# Patient Record
Sex: Female | Born: 1990 | Race: White | Hispanic: No | Marital: Single | Smoking: Current every day smoker
Health system: Southern US, Community
[De-identification: ages and names within clinical notes are randomized; demographics above are authoritative.]

## PROBLEM LIST (undated history)

## (undated) DIAGNOSIS — F419 Anxiety disorder, unspecified: Secondary | ICD-10-CM

## (undated) DIAGNOSIS — R112 Nausea with vomiting, unspecified: Secondary | ICD-10-CM

## (undated) DIAGNOSIS — N83209 Unspecified ovarian cyst, unspecified side: Secondary | ICD-10-CM

## (undated) DIAGNOSIS — N760 Acute vaginitis: Secondary | ICD-10-CM

## (undated) DIAGNOSIS — B999 Unspecified infectious disease: Secondary | ICD-10-CM

## (undated) DIAGNOSIS — N390 Urinary tract infection, site not specified: Secondary | ICD-10-CM

## (undated) DIAGNOSIS — B9689 Other specified bacterial agents as the cause of diseases classified elsewhere: Secondary | ICD-10-CM

## (undated) DIAGNOSIS — Z9889 Other specified postprocedural states: Secondary | ICD-10-CM

## (undated) DIAGNOSIS — G971 Other reaction to spinal and lumbar puncture: Secondary | ICD-10-CM

## (undated) DIAGNOSIS — D649 Anemia, unspecified: Secondary | ICD-10-CM

---
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
## 2008-02-26 ENCOUNTER — Emergency Department (HOSPITAL_COMMUNITY): Admission: EM | Admit: 2008-02-26 | Discharge: 2008-02-26 | Payer: Self-pay | Admitting: Family Medicine

## 2008-04-02 ENCOUNTER — Emergency Department (HOSPITAL_COMMUNITY): Admission: EM | Admit: 2008-04-02 | Discharge: 2008-04-02 | Payer: Self-pay | Admitting: Emergency Medicine

## 2009-06-22 ENCOUNTER — Emergency Department (HOSPITAL_COMMUNITY): Admission: EM | Admit: 2009-06-22 | Discharge: 2009-06-23 | Payer: Self-pay | Admitting: Emergency Medicine

## 2009-07-20 ENCOUNTER — Emergency Department (HOSPITAL_COMMUNITY): Admission: EM | Admit: 2009-07-20 | Discharge: 2009-07-20 | Payer: Self-pay | Admitting: Emergency Medicine

## 2009-09-17 ENCOUNTER — Ambulatory Visit: Payer: Self-pay | Admitting: Diagnostic Radiology

## 2009-09-17 ENCOUNTER — Emergency Department (HOSPITAL_BASED_OUTPATIENT_CLINIC_OR_DEPARTMENT_OTHER): Admission: EM | Admit: 2009-09-17 | Discharge: 2009-09-17 | Payer: Self-pay | Admitting: Emergency Medicine

## 2009-11-14 ENCOUNTER — Emergency Department (HOSPITAL_BASED_OUTPATIENT_CLINIC_OR_DEPARTMENT_OTHER): Admission: EM | Admit: 2009-11-14 | Discharge: 2009-11-14 | Payer: Self-pay | Admitting: Emergency Medicine

## 2010-04-04 LAB — DIFFERENTIAL
Basophils Relative: 0 % (ref 0–1)
Eosinophils Absolute: 0.3 10*3/uL (ref 0.0–0.7)
Eosinophils Relative: 4 % (ref 0–5)
Lymphocytes Relative: 40 % (ref 12–46)
Lymphs Abs: 3.4 10*3/uL (ref 0.7–4.0)
Monocytes Absolute: 0.7 10*3/uL (ref 0.1–1.0)
Monocytes Relative: 9 % (ref 3–12)
Neutro Abs: 3.9 10*3/uL (ref 1.7–7.7)
Neutrophils Relative %: 47 % (ref 43–77)

## 2010-04-04 LAB — BASIC METABOLIC PANEL
Calcium: 8.8 mg/dL (ref 8.4–10.5)
Chloride: 109 mEq/L (ref 96–112)
GFR calc non Af Amer: >60 mL/min (ref >60)
Glucose, Bld: 95 mg/dL (ref 70–99)
Potassium: 3.7 mEq/L (ref 3.5–5.1)

## 2010-04-04 LAB — WET PREP, GENITAL
Trich, Wet Prep: NONE SEEN
Yeast Wet Prep HPF POC: NONE SEEN

## 2010-04-04 LAB — URINE CULTURE: Colony Count: >=100000

## 2010-04-04 LAB — URINALYSIS, ROUTINE W REFLEX MICROSCOPIC
Glucose, UA: NEGATIVE mg/dL
Hgb urine dipstick: NEGATIVE
Nitrite: NEGATIVE
Urobilinogen, UA: 1 mg/dL (ref 0.0–1.0)

## 2010-04-04 LAB — GC/CHLAMYDIA PROBE AMP, GENITAL
Chlamydia, DNA Probe: NEGATIVE
GC Probe Amp, Genital: NEGATIVE

## 2010-04-04 LAB — URINE MICROSCOPIC-ADD ON

## 2010-04-04 LAB — RPR: RPR Ser Ql: NONREACTIVE

## 2010-04-04 LAB — CBC: MCHC: 34.6 g/dL (ref 30.0–36.0)

## 2010-04-28 LAB — URINALYSIS, ROUTINE W REFLEX MICROSCOPIC
Glucose, UA: NEGATIVE mg/dL
Ketones, ur: NEGATIVE mg/dL
Nitrite: NEGATIVE
Specific Gravity, Urine: 1.028 (ref 1.005–1.030)
Urobilinogen, UA: 0.2 mg/dL (ref 0.0–1.0)
pH: 5.5 (ref 5.0–8.0)

## 2010-04-28 LAB — URINE MICROSCOPIC-ADD ON

## 2010-04-28 LAB — COMPREHENSIVE METABOLIC PANEL
ALT: 17 U/L (ref 0–35)
Alkaline Phosphatase: 90 U/L (ref 39–117)
BUN: 13 mg/dL (ref 6–23)
GFR calc non Af Amer: >60 mL/min (ref >60)
Glucose, Bld: 78 mg/dL (ref 70–99)
Potassium: 4.7 mEq/L (ref 3.5–5.1)
Sodium: 140 mEq/L (ref 135–145)
Total Bilirubin: 0.9 mg/dL (ref 0.3–1.2)

## 2010-04-28 LAB — DIFFERENTIAL
Basophils Relative: 0 % (ref 0–1)
Eosinophils Absolute: 0.2 10*3/uL (ref 0.0–0.7)
Lymphs Abs: 0.8 10*3/uL (ref 0.7–4.0)
Neutrophils Relative %: 88 % — ABNORMAL HIGH (ref 43–77)

## 2010-04-28 LAB — LACTIC ACID, PLASMA: Lactic Acid, Venous: 0.4 mmol/L — ABNORMAL LOW (ref 0.5–2.2)

## 2010-05-23 ENCOUNTER — Emergency Department: Payer: Self-pay | Admitting: Emergency Medicine

## 2010-11-13 ENCOUNTER — Emergency Department (HOSPITAL_COMMUNITY)
Admission: EM | Admit: 2010-11-13 | Discharge: 2010-11-13 | Discharge status: Against Medical Advice | Payer: Self-pay | Attending: Emergency Medicine | Admitting: Emergency Medicine

## 2010-11-13 DIAGNOSIS — R109 Unspecified abdominal pain: Secondary | ICD-10-CM | POA: Insufficient documentation

## 2010-11-19 ENCOUNTER — Inpatient Hospital Stay (HOSPITAL_COMMUNITY)
Admission: AD | Admit: 2010-11-19 | Discharge: 2010-11-20 | Disposition: A | Discharge status: Home / Self Care | Payer: Medicaid Other | Source: Ambulatory Visit | Attending: Family Medicine | Admitting: Family Medicine

## 2010-11-19 DIAGNOSIS — N390 Urinary tract infection, site not specified: Secondary | ICD-10-CM | POA: Insufficient documentation

## 2010-11-19 DIAGNOSIS — R109 Unspecified abdominal pain: Secondary | ICD-10-CM | POA: Insufficient documentation

## 2010-11-19 HISTORY — DX: Other specified bacterial agents as the cause of diseases classified elsewhere: B96.89

## 2010-11-19 HISTORY — DX: Urinary tract infection, site not specified: N39.0

## 2010-11-19 HISTORY — DX: Other specified postprocedural states: Z98.890

## 2010-11-19 HISTORY — DX: Anxiety disorder, unspecified: F41.9

## 2010-11-19 HISTORY — DX: Unspecified infectious disease: B99.9

## 2010-11-19 HISTORY — DX: Other reaction to spinal and lumbar puncture: G97.1

## 2010-11-19 HISTORY — DX: Other specified postprocedural states: R11.2

## 2010-11-19 HISTORY — DX: Acute vaginitis: N76.0

## 2010-11-19 HISTORY — DX: Unspecified ovarian cyst, unspecified side: N83.209

## 2010-11-19 HISTORY — DX: Anemia, unspecified: D64.9

## 2010-11-19 LAB — HCG, QUANTITATIVE, PREGNANCY: hCG, Beta Chain, Quant, S: <1 m[IU]/mL (ref <5)

## 2010-11-19 LAB — CBC
MCH: 32.9 pg (ref 26.0–34.0)
MCHC: 34.1 g/dL (ref 30.0–36.0)
MCV: 96.5 fL (ref 78.0–100.0)
Platelets: 219 10*3/uL (ref 150–400)

## 2010-11-19 LAB — URINALYSIS, ROUTINE W REFLEX MICROSCOPIC
Glucose, UA: NEGATIVE mg/dL
Nitrite: POSITIVE — AB
Protein, ur: 30 mg/dL — AB
Urobilinogen, UA: 2 mg/dL — ABNORMAL HIGH (ref 0.0–1.0)
pH: 7 (ref 5.0–8.0)

## 2010-11-19 LAB — URINE MICROSCOPIC-ADD ON

## 2010-11-19 LAB — POCT PREGNANCY, URINE: Preg Test, Ur: NEGATIVE

## 2010-11-19 NOTE — Progress Notes (Addendum)
Pt stated she took a HPT that was positive this morning. Then she started bleeding this afternoon and  Second pregnancy test was negative. Also reports having  Lower pelvic abd pain  X 1 week

## 2010-11-20 ENCOUNTER — Encounter (HOSPITAL_COMMUNITY): Payer: Self-pay | Admitting: *Deleted

## 2010-11-20 NOTE — ED Notes (Signed)
Marie Williams CNM at bedside 

## 2010-11-23 NOTE — ED Provider Notes (Signed)
History     Chief Complaint  Patient presents with  . Abdominal Pain  . Possible Pregnancy   HPI 20 y.o. who presents with c/o + UPT at home then had a negative one. Started period yesterday. Has had some dysuria and pelvic pain   (seen written note scanned in, during downtime)  OB History    Grav Para Term Preterm Abortions TAB SAB Ect Mult Living   1 1 1       1       Past Medical History  Diagnosis Date  . PONV (postoperative nausea and vomiting)   . Spinal headache   . Infection   . Urinary tract infection   . Bacterial vaginosis   . Ovarian cyst 2008  . Anxiety   . Anemia     Past Surgical History  Procedure Date  . Cesarean section     Family History  Problem Relation Age of Onset  . Anesthesia problems Neg Hx   . Hypotension Neg Hx   . Malignant hyperthermia Neg Hx   . Pseudochol deficiency Neg Hx     History  Substance Use Topics  . Smoking status: Current Everyday Smoker -- 0.5 packs/day  . Smokeless tobacco: Never Used  . Alcohol Use: No    Allergies: No Known Allergies  No prescriptions prior to admission    ROS See above  Physical Exam   Blood pressure 116/78, pulse 81, temperature 97.8 F (36.6 C), temperature source Oral, resp. rate 20, height 5\' 7"  (1.702 m), weight 183 lb 3.2 oz (83.099 kg), last menstrual period 10/25/2010.  Physical Exam  Constitutional: She is oriented to person, place, and time. She appears well-developed and well-nourished. No distress.  HENT:  Head: Normocephalic.  Cardiovascular: Normal rate.   Respiratory: Effort normal.  GI: Soft.  Genitourinary: Vagina normal and uterus normal. No vaginal discharge found.       No blood in vault, uterus nontender not enlarged  Musculoskeletal: Normal range of motion.  Neurological: She is alert and oriented to person, place, and time.  Skin: Skin is warm and dry.  Psychiatric: She has a normal mood and affect.   Quant HCG <1 Wet prep  See results UA +  nitrites  MAU Course  Procedures  Assessment and Plan  Not Pregnant UTI  Will treat with Septra DS X 3 days Followup as needed [facility] 11/23/2010, 4:48 PM

## 2010-11-24 NOTE — ED Provider Notes (Signed)
Chart reviewed and agree with management and plan.  

## 2012-10-19 ENCOUNTER — Emergency Department (INDEPENDENT_AMBULATORY_CARE_PROVIDER_SITE_OTHER): Payer: Self-pay

## 2012-10-19 ENCOUNTER — Emergency Department (INDEPENDENT_AMBULATORY_CARE_PROVIDER_SITE_OTHER)
Admission: EM | Admit: 2012-10-19 | Discharge: 2012-10-19 | Disposition: A | Discharge status: Home / Self Care | Payer: Self-pay | Source: Home / Self Care | Attending: Family Medicine | Admitting: Family Medicine

## 2012-10-19 ENCOUNTER — Encounter (HOSPITAL_COMMUNITY): Payer: Self-pay | Admitting: *Deleted

## 2012-10-19 DIAGNOSIS — S93401A Sprain of unspecified ligament of right ankle, initial encounter: Secondary | ICD-10-CM

## 2012-10-19 DIAGNOSIS — S93409A Sprain of unspecified ligament of unspecified ankle, initial encounter: Secondary | ICD-10-CM

## 2012-10-19 LAB — POCT PREGNANCY, URINE: Preg Test, Ur: NEGATIVE

## 2012-10-19 LAB — POCT URINALYSIS DIP (DEVICE)
Bilirubin Urine: NEGATIVE
Ketones, ur: NEGATIVE mg/dL
Leukocytes, UA: NEGATIVE
Protein, ur: NEGATIVE mg/dL
Specific Gravity, Urine: 1.025 (ref 1.005–1.030)
pH: 7.5 (ref 5.0–8.0)

## 2012-10-19 NOTE — ED Notes (Signed)
Reports slipping in puddle of water at [employer] @ approx 1600, injuring right ankle.  Small bruise without swelling to right patella.  C/O difficulty bearing weight due to ankle pain.

## 2012-10-19 NOTE — ED Provider Notes (Signed)
CSN: [id number]     Arrival date & time 10/19/12  1734 History   First MD Initiated Contact with Patient 10/19/12 1747     Chief Complaint  Patient presents with  . Fall  . Ankle Pain   (Consider location/radiation/quality/duration/timing/severity/associated sxs/prior Treatment) Patient is a 22 y.o. female presenting with ankle pain. The history is provided by the patient and the spouse.  Ankle Pain Location:  Ankle Time since incident:  2 hours Injury: yes   Mechanism of injury: fall   Fall:    Fall occurred: on wet floor in [employer], landing on right side.   Impact surface:  Concrete   Entrapped after fall: no   Ankle location:  R ankle Pain details:    Quality:  Sharp Associated symptoms comment:  Nausea last eve, concerned about possible preg, sore on right iliac crest from fall, no n/v/d today.   Past Medical History  Diagnosis Date  . PONV (postoperative nausea and vomiting)   . Spinal headache   . Infection   . Urinary tract infection   . Bacterial vaginosis   . Ovarian cyst 2008  . Anxiety   . Anemia    Past Surgical History  Procedure Laterality Date  . Cesarean section     Family History  Problem Relation Age of Onset  . Anesthesia problems Neg Hx   . Hypotension Neg Hx   . Malignant hyperthermia Neg Hx   . Pseudochol deficiency Neg Hx    History  Substance Use Topics  . Smoking status: Current Every Day Smoker -- 0.50 packs/day    Types: Cigarettes  . Smokeless tobacco: Never Used  . Alcohol Use: No   OB History   Grav Para Term Preterm Abortions TAB SAB Ect Mult Living   1 1 1       1      Review of Systems  Allergies  Review of patient's allergies indicates no known allergies.  Home Medications   Current Outpatient Rx  Name  Route  Sig  Dispense  Refill  . prenatal vitamin w/FE, FA (PRENATAL 1 + 1) 27-1 MG TABS   Oral   Take 1 tablet by mouth daily.            BP 118/76  Pulse 81  Temp(Src) 98 F (36.7 C) (Oral)  Resp 18   SpO2 96%  LMP 10/05/2012 Physical Exam  Nursing note and vitals reviewed. Constitutional: She is oriented to person, place, and time. She appears well-developed and well-nourished. No distress.  Abdominal: Soft. Bowel sounds are normal. She exhibits no mass. There is no tenderness. There is no rebound and no guarding.  Musculoskeletal: She exhibits tenderness.       Right ankle: She exhibits swelling. She exhibits no ecchymosis, no deformity and normal pulse. Tenderness. Lateral malleolus tenderness found. No medial malleolus, no head of 5th metatarsal and no proximal fibula tenderness found. Achilles tendon normal.  Neurological: She is alert and oriented to person, place, and time.  Skin: Skin is warm and dry.    ED Course  Procedures (including critical care time) Labs Review Labs Reviewed  POCT URINALYSIS DIP (DEVICE)  POCT PREGNANCY, URINE   Imaging Review Dg Ankle Complete Right  10/19/2012   CLINICAL DATA:  Fall, ankle pain, remote distal tibial fracture.  EXAM: RIGHT ANKLE - COMPLETE 3+ VIEW  COMPARISON:  09/17/2009  FINDINGS: Ankle mortise intact. The talar dome is normal. No malleolar fracture. The calcaneus is normal. Small cortical irregularity along  the anterior distal tibia at site of prior incomplete fracture.  IMPRESSION: No acute osseous abnormality.   Electronically Signed   By: Genevive Bi M.D.   On: 10/19/2012 18:16    MDM  X-rays reviewed and report per radiologist.     Linna Hoff, MD 10/19/12 [phone]

## 2013-04-30 ENCOUNTER — Encounter: Payer: Self-pay | Admitting: Obstetrics & Gynecology

## 2013-04-30 ENCOUNTER — Ambulatory Visit (INDEPENDENT_AMBULATORY_CARE_PROVIDER_SITE_OTHER): Payer: Medicaid Other | Admitting: Obstetrics & Gynecology

## 2013-04-30 ENCOUNTER — Other Ambulatory Visit (HOSPITAL_COMMUNITY)
Admission: RE | Admit: 2013-04-30 | Discharge: 2013-04-30 | Disposition: A | Discharge status: Home / Self Care | Payer: Medicaid Other | Source: Ambulatory Visit | Attending: Obstetrics & Gynecology | Admitting: Obstetrics & Gynecology

## 2013-04-30 VITALS — BP 117/70 | Wt 191.0 lb

## 2013-04-30 DIAGNOSIS — Z113 Encounter for screening for infections with a predominantly sexual mode of transmission: Secondary | ICD-10-CM | POA: Insufficient documentation

## 2013-04-30 DIAGNOSIS — O09299 Supervision of pregnancy with other poor reproductive or obstetric history, unspecified trimester: Secondary | ICD-10-CM

## 2013-04-30 DIAGNOSIS — O34219 Maternal care for unspecified type scar from previous cesarean delivery: Secondary | ICD-10-CM

## 2013-04-30 DIAGNOSIS — O99331 Smoking (tobacco) complicating pregnancy, first trimester: Secondary | ICD-10-CM | POA: Insufficient documentation

## 2013-04-30 DIAGNOSIS — Z01419 Encounter for gynecological examination (general) (routine) without abnormal findings: Secondary | ICD-10-CM | POA: Insufficient documentation

## 2013-04-30 DIAGNOSIS — O99330 Smoking (tobacco) complicating pregnancy, unspecified trimester: Secondary | ICD-10-CM

## 2013-04-30 MED ORDER — NICOTINE 21 MG/24HR TD PT24: 21.0000 mg | MEDICATED_PATCH | Freq: Every day | TRANSDERMAL | Status: AC

## 2013-04-30 NOTE — Progress Notes (Signed)
P-95 Head circ measures 17weeks and one day which is consistent with LMP dating of 17wk 3days.

## 2013-04-30 NOTE — Patient Instructions (Signed)
Vaginal Birth After Cesarean Delivery Vaginal birth after cesarean delivery (VBAC) is giving birth vaginally after previously delivering a baby by a cesarean. In the past, if a woman had a cesarean delivery, all births afterwards would be done by cesarean delivery. This is no longer true. It can be safe for the mother to try a vaginal delivery after having a cesarean delivery.  It is important to discuss VBAC with your health care provider early in the pregnancy so you can understand the risks, benefits, and options. It will give you time to decide what is best in your particular case. The final decision about whether to have a VBAC or repeat cesarean delivery should be between you and your health care provider. Any changes in your health or your baby's health during your pregnancy may make it necessary to change your initial decision about VBAC.  WOMEN WHO PLAN TO HAVE A VBAC SHOULD CHECK WITH THEIR HEALTH CARE PROVIDER TO BE SURE THAT:  The previous cesarean delivery was done with a low transverse uterine cut (incision) (not a vertical classical incision).   The birth canal is big enough for the baby.   There were no other operations on the uterus.   An electronic fetal monitor (EFM) will be on at all times during labor.   An operating room will be available and ready in case an emergency cesarean delivery is needed.   A health care provider and surgical nursing staff will be available at all times during labor to be ready to do an emergency delivery cesarean if necessary.   An anesthesiologist will be present in case an emergency cesarean delivery is needed.   The nursery is prepared and has adequate personnel and necessary equipment available to care for the baby in case of an emergency cesarean delivery. BENEFITS OF VBAC  Shorter stay in the hospital.   Avoidance of risks associated with cesarean delivery, such as:  Surgical complications, such as opening of the incision or  hernia in the incision.  Injury to other organs.  Fever. This can occur if an infection develops after surgery. It can also occur as a reaction to the medicine given to make you numb during the surgery.  Less blood loss and need for blood transfusions.  Lower risk of blood clots and infection.  Shorter recovery.   Decreased risk for having to remove the uterus (hysterectomy).   Decreased risk for the placenta to completely or partially cover the opening of the uterus (placenta previa) with a future pregnancy.   Decrease risk in future labor and delivery. RISKS OF A VBAC  Tearing (rupture) of the uterus. This is occurs in less than 1% of VBACs. The risk of this happening is higher if:  Steps are taken to begin the labor process (induce labor) or stimulate or strengthen contractions (augment labor).   Medicine is used to soften (ripen) the cervix.  Having to remove the uterus (hysterectomy) if it ruptures. VBAC SHOULD NOT BE DONE IF:  The previous cesarean delivery was done with a vertical (classical) or T-shaped incision or you do not know what kind of incision was made.   You had a ruptured uterus.   You have had certain types of surgery on your uterus, such as removal of uterine fibroids. Ask your health care provider about other types of surgeries that prevent you from having a VBAC.  You have certain medical or childbirth (obstetrical) problems.   There are problems with the baby.   You   have had two previous cesarean deliveries and no vaginal deliveries. OTHER FACTS TO KNOW ABOUT VBAC:  It is safe to have an epidural anesthetic with VBAC.   It is safe to turn the baby from a breech position (attempt an external cephalic version).   It is safe to try a VBAC with twins.   VBAC may not be successful if your baby weights 8.8 lb (4 kg) or more. However, weight predictions are not always accurate and should not be used alone to decide if VBAC is right for  you.  There is an increased failure rate if the time between the cesarean delivery and VBAC is less than 19 months.   Your health care provider may advise against a VBAC if you have preeclampsia (high blood pressure, protein in the urine, and swelling of face and extremities).   VBAC is often successful if you previously gave birth vaginally.   VBAC is often successful when the labor starts spontaneously before the due date.   Delivering a baby through a VBAC is similar to having a normal spontaneous vaginal delivery. Document Released: 06/25/2006 Document Revised: 10/23/2012 Document Reviewed: 08/01/2012 Exit[employer] Patient Information 2014 [employer].  

## 2013-04-30 NOTE — Progress Notes (Signed)
   Subjective:    Carol Abbott is a 23 y.o. G2P1001at 3256w3d being seen today for her first obstetrical visit.  Her obstetrical history is significant for previous term LTCS for FTP, baby has cerebral palsy and Danfy-Walker syndrome and requires a lot of help. Patient does intend to breast feed. Pregnancy history fully reviewed.  Patient reports no complaints.  She smokes 1/2 ppd of cigarettes, desires to quit.  Filed Vitals:   04/30/13 0934  BP: 117/70  Weight: 191 lb (86.637 kg)    HISTORY: OB History  Gravida Para Term Preterm AB SAB TAB Ectopic Multiple Living  2 1 1       1     # Outcome Date GA Lbr Len/2nd Weight Sex Delivery Anes PTL Lv  2 CUR           1 TRM 11/22/07 1212w0d  5 lb 7.3 oz (2.475 kg) F LTCS  N Y     Comments: LTCS for FTP     Past Medical History  Diagnosis Date  . PONV (postoperative nausea and vomiting)   . Spinal headache   . Infection   . Urinary tract infection   . Bacterial vaginosis   . Ovarian cyst 2008  . Anxiety   . Anemia    Past Surgical History  Procedure Laterality Date  . Cesarean section     Family History  Problem Relation Age of Onset  . Anesthesia problems Neg Hx   . Hypotension Neg Hx   . Malignant hyperthermia Neg Hx   . Pseudochol deficiency Neg Hx      Exam    Uterus:     Pelvic Exam:    Perineum: No Hemorrhoids, Normal Perineum   Vulva: normal   Vagina:  normal mucosa   Cervix: no bleeding following Pap and no cervical motion tenderness   Adnexa: normal adnexa and no mass, fullness, tenderness   Bony Pelvis: gynecoid  System: Breast:  normal appearance, no masses or tenderness   Skin: normal coloration and turgor, no rashes    Neurologic: oriented, normal   Extremities: normal strength, tone, and muscle mass   HEENT PERRLA and extra ocular movement intact   Mouth/Teeth mucous membranes moist, pharynx normal without lesions and dental hygiene poor   Neck supple and no masses   Cardiovascular: regular  rate and rhythm   Respiratory:  appears well, vitals normal, no respiratory distress, acyanotic, normal RR   Abdomen: soft, non-tender; bowel sounds normal; no masses,  no organomegaly   Urinary: urethral meatus normal      Assessment:    Pregnancy: G2P1001 Patient Active Problem List   Diagnosis Date Noted  . Previous child with anomaly, antepartum 04/30/2013  . Previous cesarean section complicating pregnancy, antepartum 04/30/2013  . Tobacco smoking complicating pregnancy 04/30/2013     Plan:   Initial labs drawn. Prenatal vitamins.  Counseled about dangers of smoking in pregnancy. Patient agreed to try nicotine patch, this was prescribed. Problem list reviewed and updated. Genetic Screening discussed Quad Screen: ordered. Ultrasound discussed; [facility]. TOLAC vs RCS risks and benefits discussed in detail. Patient elects for TOLAC, consent to be signed next visit. Follow up in 4 weeks.   Tereso NewcomerUgonna A Doroteo Nickolson, MD 04/30/2013

## 2013-05-01 ENCOUNTER — Encounter: Payer: Self-pay | Admitting: Obstetrics & Gynecology

## 2013-05-01 DIAGNOSIS — Z2839 Other underimmunization status: Secondary | ICD-10-CM | POA: Insufficient documentation

## 2013-05-01 DIAGNOSIS — O9989 Other specified diseases and conditions complicating pregnancy, childbirth and the puerperium: Secondary | ICD-10-CM

## 2013-05-01 DIAGNOSIS — Z283 Underimmunization status: Secondary | ICD-10-CM | POA: Insufficient documentation

## 2013-05-01 LAB — OBSTETRIC PANEL
Antibody Screen: NEGATIVE
BASOS ABS: 0 10*3/uL (ref 0.0–0.1)
Basophils Relative: 0 % (ref 0–1)
EOS ABS: 0.3 10*3/uL (ref 0.0–0.7)
EOS PCT: 3 % (ref 0–5)
HCT: 35.6 % — ABNORMAL LOW (ref 36.0–46.0)
Hemoglobin: 12.5 g/dL (ref 12.0–15.0)
Hepatitis B Surface Ag: NEGATIVE
LYMPHS PCT: 18 % (ref 12–46)
Lymphs Abs: 1.6 10*3/uL (ref 0.7–4.0)
MCH: 32.6 pg (ref 26.0–34.0)
MCHC: 35.1 g/dL (ref 30.0–36.0)
MCV: 93 fL (ref 78.0–100.0)
Monocytes Absolute: 0.4 10*3/uL (ref 0.1–1.0)
Monocytes Relative: 5 % (ref 3–12)
Neutro Abs: 6.4 10*3/uL (ref 1.7–7.7)
Neutrophils Relative %: 74 % (ref 43–77)
PLATELETS: 229 10*3/uL (ref 150–400)
RBC: 3.83 MIL/uL — ABNORMAL LOW (ref 3.87–5.11)
RDW: 14.2 % (ref 11.5–15.5)
Rh Type: POSITIVE
Rubella: 0.43 Index (ref <0.90)
WBC: 8.7 10*3/uL (ref 4.0–10.5)

## 2013-05-01 LAB — AFP, QUAD SCREEN
AFP: 32.9 IU/mL
CURR GEST AGE: 17.3 wks.days
Down Syndrome Scr Risk Est: 1:500 {titer}
HCG, Total: 19068 m[IU]/mL
INH: 397.3 pg/mL
Interpretation-AFP: NEGATIVE
MOM FOR AFP: 1.1
MoM for INH: 2.44
MoM for hCG: 1.18
Open Spina bifida: NEGATIVE
Tri 18 Scr Risk Est: NEGATIVE
UE3 VALUE: 0.4 ng/mL
uE3 Mom: 0.62

## 2013-05-01 LAB — ALCOHOL METABOLITE (ETG), URINE: ETGU: NEGATIVE ng/mL

## 2013-05-01 LAB — HIV ANTIBODY (ROUTINE TESTING W REFLEX): HIV: NONREACTIVE

## 2013-05-01 LAB — CULTURE, OB URINE
COLONY COUNT: NO GROWTH
ORGANISM ID, BACTERIA: NO GROWTH

## 2013-05-02 LAB — CYSTIC FIBROSIS DIAGNOSTIC STUDY

## 2013-05-03 LAB — COCAINE METABOLITE (GC/LC/MS), URINE: BENZOYLECGONINE GC/MS CONF: 275 ng/mL — AB

## 2013-05-05 ENCOUNTER — Encounter: Payer: Self-pay | Admitting: Obstetrics & Gynecology

## 2013-05-05 DIAGNOSIS — F1410 Cocaine abuse, uncomplicated: Secondary | ICD-10-CM | POA: Insufficient documentation

## 2013-05-05 DIAGNOSIS — O99322 Drug use complicating pregnancy, second trimester: Secondary | ICD-10-CM

## 2013-05-05 LAB — PRESCRIPTION MONITORING PROFILE (19 PANEL)
Amphetamine/Meth: NEGATIVE ng/mL
Barbiturate Screen, Urine: NEGATIVE ng/mL
Benzodiazepine Screen, Urine: NEGATIVE ng/mL
Buprenorphine, Urine: NEGATIVE ng/mL
CREATININE, URINE: 261.89 mg/dL (ref >20.0)
Cannabinoid Scrn, Ur: NEGATIVE ng/mL
Carisoprodol, Urine: NEGATIVE ng/mL
ECSTASY: NEGATIVE ng/mL
FENTANYL URINE: NEGATIVE ng/mL
MEPERIDINE UR: NEGATIVE ng/mL
Methadone Screen, Urine: NEGATIVE ng/mL
Methaqualone: NEGATIVE ng/mL
Nitrites, Initial: NEGATIVE ug/mL
OXYCODONE SCRN UR: NEGATIVE ng/mL
Opiate Screen, Urine: NEGATIVE ng/mL
PH URINE, INITIAL: 6.4 pH (ref 4.5–8.9)
Phencyclidine, Ur: NEGATIVE ng/mL
Propoxyphene: NEGATIVE ng/mL
TRAMADOL UR: NEGATIVE ng/mL
Tapentadol, urine: NEGATIVE ng/mL
Zolpidem, Urine: NEGATIVE ng/mL

## 2013-05-08 ENCOUNTER — Ambulatory Visit (HOSPITAL_COMMUNITY)
Admission: RE | Admit: 2013-05-08 | Discharge: 2013-05-08 | Disposition: A | Discharge status: Home / Self Care | Payer: Medicaid Other | Source: Ambulatory Visit | Attending: Family Medicine | Admitting: Family Medicine

## 2013-05-08 ENCOUNTER — Ambulatory Visit (HOSPITAL_COMMUNITY)
Admission: RE | Admit: 2013-05-08 | Discharge: 2013-05-08 | Disposition: A | Discharge status: Home / Self Care | Payer: Medicaid Other | Source: Ambulatory Visit | Attending: Obstetrics & Gynecology | Admitting: Obstetrics & Gynecology

## 2013-05-08 DIAGNOSIS — IMO0002 Reserved for concepts with insufficient information to code with codable children: Secondary | ICD-10-CM | POA: Insufficient documentation

## 2013-05-08 DIAGNOSIS — Z8269 Family history of other diseases of the musculoskeletal system and connective tissue: Secondary | ICD-10-CM | POA: Insufficient documentation

## 2013-05-08 DIAGNOSIS — O34219 Maternal care for unspecified type scar from previous cesarean delivery: Secondary | ICD-10-CM | POA: Insufficient documentation

## 2013-05-08 DIAGNOSIS — O09299 Supervision of pregnancy with other poor reproductive or obstetric history, unspecified trimester: Secondary | ICD-10-CM

## 2013-05-09 ENCOUNTER — Encounter: Payer: Self-pay | Admitting: Obstetrics & Gynecology

## 2013-05-09 ENCOUNTER — Encounter (HOSPITAL_COMMUNITY): Payer: Self-pay

## 2013-05-09 DIAGNOSIS — O350XX0 Maternal care for (suspected) central nervous system malformation in fetus, not applicable or unspecified: Secondary | ICD-10-CM | POA: Insufficient documentation

## 2013-05-09 DIAGNOSIS — O3503X0 Maternal care for (suspected) central nervous system malformation or damage in fetus, choroid plexus cysts, not applicable or unspecified: Secondary | ICD-10-CM | POA: Insufficient documentation

## 2013-05-09 NOTE — Progress Notes (Signed)
Genetic Abbott  High-Risk Gestation Note  Appointment Date:  05/08/2013 Referred By: Carol Jude, MD Date of Birth:  [birth year]    Pregnancy History: G2P1001 Estimated Date of Delivery: 10/05/13 Estimated Gestational Age: 66w4dAttending: JJolyn Lent MD   I met with [name] because of a family history of Carol Abbott. Ms. AThurlowwas accompanied by her daughter to today's visit.   We began by reviewing the family history in detail. The couple's daughter, Carol Abbott has Carol Abbott. The patient reported that this was seen on prenatal ultrasound during her pregnancy. [place]. Her daughter also has seizures, which began at age 25 months She is currently followed by neurologist, Dr. HGaynell Abbott She has developmental delay. The patient reported that her daughter is unable to walk and does not have much developed speech. She says a few words and can shake her head to communicate. [place] [facility] [name] reports that an underlying cause was not found for her daughter's features and that "all testing was normal."  We do not currently have documentation regarding what testing was performed.   Carol Abbott (DVM) is a congenital brain malformation characterized by enlargement of the fourth ventricle, absence of the cerebellar vermis, and the formation of a cyst near the base of the skull.  This finding is associated with other intra and extracranial anomalies in 50% and 35% of cases, respectively.  DWM can be caused by chromosomal, single gene, multifactorial, and environmental etiologies. The majority of apparently isolated cases of DWM appear to be sporadic. Carol Abbott the presence of teratogens during her pregnancy with her daughter that have been associated with DVM, including alcohol, uncontrolled maternal diabetes mellitus, and infections (rubella).  Given  that DWM has also been associated with various single gene conditions, we reviewed chromosomes, genes, and various inheritance patterns, specifically autosomal recessive inheritance. When sporadic, the estimated recurrence risk for siblings is approximately 1-5%. In the case of single gene disorders or autosomal recessive inheritance, the recurrence risk may be up to 25%.  In summary, she was counseled that the risk of recurrence for the current pregnancy depends on the cause for her daughter's DWM, ranging from <1 to 25%, accounting for recessive genetic syndromes.  This range can be further modified, if an etiology is discovered. In the absence of an identified genetic etiology, prenatal testing via amniocentesis would not be expected to be informative for the current pregnancy. We discussed that targeted ultrasound is available to assess fetal growth and anatomy in detail. Targeted ultrasound was performed today. No gross fetal anomalies were visualized today. See separate ultrasound report for complete discussion regarding today's ultrasound. Follow-up ultrasound was planned in 4 weeks. She understands that ultrasound cannot rule out all birth defects or genetic syndromes.  The family histories were otherwise found to be contributory for learning disabilities for individuals in the father of the pregnancy's family. Reportedly his maternal half-sister and maternal half-brother (full siblings to each other), and his mother has learning disabilities. They are unable to read or write well per the patient's report. His half-sister's two sons reportedly also have learning disabilities and speech impediments. An underlying cause is not known for the learning disabilities. The father of the pregnancy was described to have no learning disabilities. Carol Abbott counseled that there are many different causes of intellectual disabilities including environmental, multifactorial, and genetic etiologies.  We discussed  that a  specific diagnosis for intellectual disability can be determined in approximately 50% of these individuals.  In the remaining 50% of individuals, a diagnosis may never be determined.  Regarding genetic causes, we discussed that chromosome aberrations (aneuploidy, deletions, duplications, insertions, and translocations) are responsible for a small percentage of individuals with intellectual disability.  Many individuals with chromosome aberrations have additional differences, including congenital anomalies or minor dysmorphisms.  Likewise, single gene conditions are the underlying cause of intellectual delay in some families.  We discussed that many gene conditions have intellectual disability as a feature, but also often include other physical or medical differences.  Based on the reported family history, relatives do appear to have an increased risk for learning disabilities. Given the degree of relation and given that the father of the pregnancy is unaffected, recurrence risk for the current pregnancy would likely be lower than for a first degree relative to an affected individual. However, recurrence risk is difficult to assess without additional information regarding the etiology. The patient understands that we are unable to screen prenatally for learning disabilities. It would be helpful to make the pediatrician aware of this history.    Additionally, the father of the pregnancy has a female maternal first cousin (his maternal aunt's daughter) who has a severe learning disability and was born missing one eye. She is currently in her late 20's and lives with her father. An etiology was not known for her features by Carol Abbott. We discussed that without more specific information, it is difficult to provide an accurate risk assessment.  Further genetic Abbott is warranted if more information is obtained.  The father of the pregnancy also reportedly has a female maternal first cousin twice removed with a  congenital heart defect, requiring two heart surgeries. He is otherwise reportedly healthy.  Congenital heart defects occur in approximately 1% of pregnancies.  Congenital heart defects may occur due to multifactorial influences, chromosomal abnormalities, genetic syndromes or environmental exposures.  Isolated heart defects are generally multifactorial.  Given the reported family history and assuming multifactorial inheritance, the risk for a congenital heart defect in the current pregnancy does not appear to be increased above the general population risk.   Carol Abbott reported a maternal aunt with albinism. She reported that her skin and hair pigment is affected, as well as her vision. An underlying cause is not known, and she is otherwise healthy. We discussed that there are various forms of albinism, which can occur and be inherited in a family in different ways. Autosomal recessive and X-linked inheritance have been observed for albinism. Given the reported family history and given that Carol Abbott and her mother are reportedly unaffected, recurrence risk for the current pregnancy would likely be low. Without further information regarding the provided family history, an accurate genetic risk cannot be calculated. Further genetic Abbott is warranted if more information is obtained.  Carol Abbott was provided with written information regarding cystic fibrosis (CF) including the carrier frequency and incidence in the Caucasian population, the availability of carrier testing and prenatal diagnosis if indicated.  In addition, we discussed that CF is routinely screened for as part of the [place] newborn screening panel.  Testing was performed through her OB office and was negative for the mutations screened. Thus, her risk to be a CF carrier has been reduced.   Ms. Elmore denied exposure to environmental toxins or chemical agents. She denied the use of alcohol or illicit drugs. She reported smoking approximately 5 cigarettes  per day. The associations of smoking in pregnancy were reviewed and cessation encouraged. She denied significant viral illnesses during the course of her pregnancy. Her medical and surgical histories were noncontributory   I counseled Ms. IZZABELLE BOULEY regarding the above risks and available options.  The approximate Abbott-to-Abbott time with the genetic counselor was 40 minutes.   Kandra Nicolas Gildardo Griffes, MS Certified Genetic Counselor 05/09/2013

## 2013-05-12 ENCOUNTER — Other Ambulatory Visit: Payer: Self-pay | Admitting: Family Medicine

## 2013-05-12 DIAGNOSIS — O99340 Other mental disorders complicating pregnancy, unspecified trimester: Secondary | ICD-10-CM

## 2013-05-12 DIAGNOSIS — O352XX0 Maternal care for (suspected) hereditary disease in fetus, not applicable or unspecified: Secondary | ICD-10-CM

## 2013-05-12 DIAGNOSIS — O99320 Drug use complicating pregnancy, unspecified trimester: Secondary | ICD-10-CM

## 2013-05-12 DIAGNOSIS — F1920 Other psychoactive substance dependence, uncomplicated: Secondary | ICD-10-CM

## 2013-05-14 ENCOUNTER — Encounter: Payer: Medicaid Other | Admitting: Family Medicine

## 2013-05-23 ENCOUNTER — Encounter: Payer: Medicaid Other | Admitting: Obstetrics & Gynecology

## 2013-06-05 ENCOUNTER — Encounter (HOSPITAL_COMMUNITY): Payer: Self-pay

## 2013-06-05 ENCOUNTER — Ambulatory Visit (HOSPITAL_COMMUNITY)
Admission: RE | Admit: 2013-06-05 | Discharge: 2013-06-05 | Disposition: A | Discharge status: Home / Self Care | Payer: Medicaid Other | Source: Ambulatory Visit | Attending: Obstetrics & Gynecology | Admitting: Obstetrics & Gynecology

## 2013-06-05 VITALS — BP 114/64 | HR 102 | Wt 188.5 lb

## 2013-06-05 DIAGNOSIS — O99340 Other mental disorders complicating pregnancy, unspecified trimester: Secondary | ICD-10-CM | POA: Insufficient documentation

## 2013-06-05 DIAGNOSIS — O352XX0 Maternal care for (suspected) hereditary disease in fetus, not applicable or unspecified: Secondary | ICD-10-CM | POA: Insufficient documentation

## 2013-06-05 DIAGNOSIS — O99331 Smoking (tobacco) complicating pregnancy, first trimester: Secondary | ICD-10-CM

## 2013-06-05 DIAGNOSIS — O09299 Supervision of pregnancy with other poor reproductive or obstetric history, unspecified trimester: Secondary | ICD-10-CM | POA: Insufficient documentation

## 2013-06-05 DIAGNOSIS — O99322 Drug use complicating pregnancy, second trimester: Secondary | ICD-10-CM

## 2013-06-05 DIAGNOSIS — O99330 Smoking (tobacco) complicating pregnancy, unspecified trimester: Secondary | ICD-10-CM | POA: Insufficient documentation

## 2013-06-05 DIAGNOSIS — O99320 Drug use complicating pregnancy, unspecified trimester: Secondary | ICD-10-CM

## 2013-06-05 DIAGNOSIS — F1920 Other psychoactive substance dependence, uncomplicated: Secondary | ICD-10-CM | POA: Insufficient documentation

## 2013-06-05 DIAGNOSIS — F1410 Cocaine abuse, uncomplicated: Secondary | ICD-10-CM

## 2013-06-06 ENCOUNTER — Encounter: Payer: Self-pay | Admitting: Family Medicine

## 2013-06-11 ENCOUNTER — Telehealth: Payer: Self-pay | Admitting: *Deleted

## 2013-06-11 ENCOUNTER — Ambulatory Visit (INDEPENDENT_AMBULATORY_CARE_PROVIDER_SITE_OTHER): Payer: Medicaid Other | Admitting: Obstetrics & Gynecology

## 2013-06-11 VITALS — BP 113/66 | HR 109 | Wt 187.0 lb

## 2013-06-11 DIAGNOSIS — F1410 Cocaine abuse, uncomplicated: Secondary | ICD-10-CM

## 2013-06-11 DIAGNOSIS — F1920 Other psychoactive substance dependence, uncomplicated: Secondary | ICD-10-CM

## 2013-06-11 DIAGNOSIS — O99330 Smoking (tobacco) complicating pregnancy, unspecified trimester: Secondary | ICD-10-CM

## 2013-06-11 DIAGNOSIS — O99320 Drug use complicating pregnancy, unspecified trimester: Secondary | ICD-10-CM

## 2013-06-11 DIAGNOSIS — G43909 Migraine, unspecified, not intractable, without status migrainosus: Secondary | ICD-10-CM

## 2013-06-11 DIAGNOSIS — O09299 Supervision of pregnancy with other poor reproductive or obstetric history, unspecified trimester: Secondary | ICD-10-CM

## 2013-06-11 DIAGNOSIS — O99331 Smoking (tobacco) complicating pregnancy, first trimester: Secondary | ICD-10-CM

## 2013-06-11 DIAGNOSIS — O34219 Maternal care for unspecified type scar from previous cesarean delivery: Secondary | ICD-10-CM

## 2013-06-11 DIAGNOSIS — O99322 Drug use complicating pregnancy, second trimester: Secondary | ICD-10-CM

## 2013-06-11 MED ORDER — SUMATRIPTAN SUCCINATE 100 MG PO TABS: 100.0000 mg | ORAL_TABLET | Freq: Once | ORAL | Status: AC | PRN

## 2013-06-11 NOTE — Progress Notes (Signed)
Pt says she is having "weird marks popping up on her skin" and she doesn't know where they are coming from.    She is also getting migraines and having nose bleeds.  She has tried Tylenol but it hasn't helped.

## 2013-06-11 NOTE — Telephone Encounter (Signed)
Spoke with patient and she questioned why there was a diagnosis on her AVS print off of cocaine abuse in second trimester.   I explained we draw prenatal labs and urine on every pregnant patient.  Her drug screen came back positive for cocaine.  Patient said she has never done a drug in her life.  Her boyfriend got on the phone yelling and cussing at me.  He said him and her wanted to come in and talk to the doctor ASAP and get this situation straighten out and get the diagnosis taken off her chart.   I explained that the doctor had to document that in her chart for documentation reasons because her drug screen was positive for cocaine. The boyfriend was cussing and yelling and said something was going to go down if this wasn't taking care of.  We have made an appointment at his request with Dr. Marice Potter tomorrow at 9:00am to discuss diagnosis.  Called and informed our director Berenice Bouton and she advised [place] to call Amy Jimmey Ralph in risk management for guidance in this matter.  Also informed her physician that saw her today Dr. Macon Large.

## 2013-06-11 NOTE — Progress Notes (Signed)
CMA note " Pt says she is having "weird marks popping up on her skin" and she doesn't know where they are coming from. She is also getting migraines and having nose bleeds. She has tried Tylenol but it hasn't helped. " Given drug history, surveillance urine screen ordered. On her description of skin lesions, sounds like occasional hives. No lesions currently. Advised to take antihistamines as needed, will continue to monitor. For migraines, Imitrex trial ordered (staying away from narcotics given history) Follow up scan for [facility] and pyelectasis scheduled on 07/2013 TOLAC consent signed today.   Third trimester labs, Tdap  next visit. No other complaints or concerns.  Fetal movement and labor precautions reviewed.

## 2013-06-11 NOTE — Patient Instructions (Addendum)
Return to clinic for any obstetric concerns or go to [facility] for evaluation Tetanus, Diphtheria, Pertussis (Tdap) Vaccine What You Need to Know WHY GET VACCINATED? Tetanus, diphtheria and pertussis can be very serious diseases, even for adolescents and adults. Tdap vaccine can protect us from these diseases. TETANUS (Lockjaw) causes painful muscle tightening and stiffness, usually all over the body.  It can lead to tightening of muscles in the head and neck so you can't open your mouth, swallow, or sometimes even breathe. Tetanus kills about 1 out of 5 people who are infected. DIPHTHERIA can cause a thick coating to form in the back of the throat.  It can lead to breathing problems, paralysis, heart failure, and death. PERTUSSIS (Whooping Cough) causes severe coughing spells, which can cause difficulty breathing, vomiting and disturbed sleep.  It can also lead to weight loss, incontinence, and rib fractures. Up to 2 in 100 adolescents and 5 in 100 adults with pertussis are hospitalized or have complications, which could include pneumonia and death. These diseases are caused by bacteria. Diphtheria and pertussis are spread from person to person through coughing or sneezing. Tetanus enters the body through cuts, scratches, or wounds. Before vaccines, the [place] saw as many as 200,000 cases a year of diphtheria and pertussis, and hundreds of cases of tetanus. Since vaccination began, tetanus and diphtheria have dropped by about 99% and pertussis by about 80%. TDAP VACCINE Tdap vaccine can protect adolescents and adults from tetanus, diphtheria, and pertussis. One dose of Tdap is routinely given at age 11 or 12. People who did not get Tdap at that age should get it as soon as possible. Tdap is especially important for health care professionals and anyone having close contact with a baby younger than 12 months. Pregnant women should get a dose of Tdap during every pregnancy, to protect the newborn  from pertussis. Infants are most at risk for severe, life-threatening complications from pertussis. A similar vaccine, called Td, protects from tetanus and diphtheria, but not pertussis. A Td booster should be given every 10 years. Tdap may be given as one of these boosters if you have not already gotten a dose. Tdap may also be given after a severe cut or burn to prevent tetanus infection. Your doctor can give you more information. Tdap may safely be given at the same time as other vaccines. SOME PEOPLE SHOULD NOT GET THIS VACCINE  If you ever had a life-threatening allergic reaction after a dose of any tetanus, diphtheria, or pertussis containing vaccine, OR if you have a severe allergy to any part of this vaccine, you should not get Tdap. Tell your doctor if you have any severe allergies.  If you had a coma, or long or multiple seizures within 7 days after a childhood dose of DTP or DTaP, you should not get Tdap, unless a cause other than the vaccine was found. You can still get Td.  Talk to your doctor if you:  have epilepsy or another nervous system problem,  had severe pain or swelling after any vaccine containing diphtheria, tetanus or pertussis,  ever had Guillain-Barr Syndrome (GBS),  aren't feeling well on the day the shot is scheduled. RISKS OF A VACCINE REACTION With any medicine, including vaccines, there is a chance of side effects. These are usually mild and go away on their own, but serious reactions are also possible. Brief fainting spells can follow a vaccination, leading to injuries from falling. Sitting or lying down for about 15 minutes can   help prevent these. Tell your doctor if you feel dizzy or light-headed, or have vision changes or ringing in the ears. Mild problems following Tdap (Did not interfere with activities)  Pain where the shot was given (about 3 in 4 adolescents or 2 in 3 adults)  Redness or swelling where the shot was given (about 1 person in 5)  Mild  fever of at least 100.4F (up to about 1 in 25 adolescents or 1 in 100 adults)  Headache (about 3 or 4 people in 10)  Tiredness (about 1 person in 3 or 4)  Nausea, vomiting, diarrhea, stomach ache (up to 1 in 4 adolescents or 1 in 10 adults)  Chills, body aches, sore joints, rash, swollen glands (uncommon) Moderate problems following Tdap (Interfered with activities, but did not require medical attention)  Pain where the shot was given (about 1 in 5 adolescents or 1 in 100 adults)  Redness or swelling where the shot was given (up to about 1 in 16 adolescents or 1 in 25 adults)  Fever over 102F (about 1 in 100 adolescents or 1 in 250 adults)  Headache (about 3 in 20 adolescents or 1 in 10 adults)  Nausea, vomiting, diarrhea, stomach ache (up to 1 or 3 people in 100)  Swelling of the entire arm where the shot was given (up to about 3 in 100). Severe problems following Tdap (Unable to perform usual activities, required medical attention)  Swelling, severe pain, bleeding and redness in the arm where the shot was given (rare). A severe allergic reaction could occur after any vaccine (estimated less than 1 in a million doses). WHAT IF THERE IS A SERIOUS REACTION? What should I look for?  Look for anything that concerns you, such as signs of a severe allergic reaction, very high fever, or behavior changes. Signs of a severe allergic reaction can include hives, swelling of the face and throat, difficulty breathing, a fast heartbeat, dizziness, and weakness. These would start a few minutes to a few hours after the vaccination. What should I do?  If you think it is a severe allergic reaction or other emergency that can't wait, call 9-1-1 or get the person to the nearest hospital. Otherwise, call your doctor.  Afterward, the reaction should be reported to the "Vaccine Adverse Event Reporting System" (VAERS). Your doctor might file this report, or you can do it yourself through the VAERS web  site at www.vaers.hhs.gov, or by calling 1-800-822-7967. VAERS is only for reporting reactions. They do not give medical advice.  THE NATIONAL VACCINE INJURY COMPENSATION PROGRAM The National Vaccine Injury Compensation Program (VICP) is a federal program that was created to compensate people who may have been injured by certain vaccines. Persons who believe they may have been injured by a vaccine can learn about the program and about filing a claim by calling 1-800-338-2382 or visiting the VICP website at www.hrsa.gov/vaccinecompensation. HOW CAN I LEARN MORE?  Ask your doctor.  Call your local or state health department.  Contact the Centers for Disease Control and Prevention (CDC):  Call 1-800-232-4636 or visit CDC's website at www.cdc.gov/vaccines. CDC Tdap Vaccine VIS (05/25/11) Document Released: 07/04/2011 Document Revised: 04/29/2012 Document Reviewed: 04/24/2012 ExitCare Patient Information 2014 [employer].  

## 2013-06-12 ENCOUNTER — Ambulatory Visit (INDEPENDENT_AMBULATORY_CARE_PROVIDER_SITE_OTHER): Payer: Medicaid Other | Admitting: Obstetrics & Gynecology

## 2013-06-12 DIAGNOSIS — Z3480 Encounter for supervision of other normal pregnancy, unspecified trimester: Secondary | ICD-10-CM

## 2013-06-12 DIAGNOSIS — F1920 Other psychoactive substance dependence, uncomplicated: Secondary | ICD-10-CM

## 2013-06-12 DIAGNOSIS — O34219 Maternal care for unspecified type scar from previous cesarean delivery: Secondary | ICD-10-CM

## 2013-06-12 DIAGNOSIS — O99320 Drug use complicating pregnancy, unspecified trimester: Secondary | ICD-10-CM

## 2013-06-12 LAB — PRESCRIPTION MONITORING PROFILE (19 PANEL)
Amphetamine/Meth: NEGATIVE ng/mL
BUPRENORPHINE, URINE: NEGATIVE ng/mL
Barbiturate Screen, Urine: NEGATIVE ng/mL
Benzodiazepine Screen, Urine: NEGATIVE ng/mL
CARISOPRODOL, URINE: NEGATIVE ng/mL
COCAINE METABOLITES: NEGATIVE ng/mL
Cannabinoid Scrn, Ur: NEGATIVE ng/mL
Creatinine, Urine: 291.64 mg/dL (ref >20.0)
ECSTASY: NEGATIVE ng/mL
FENTANYL URINE: NEGATIVE ng/mL
MEPERIDINE UR: NEGATIVE ng/mL
METHADONE SCREEN, URINE: NEGATIVE ng/mL
METHAQUALONE SCREEN (URINE): NEGATIVE ng/mL
NITRITES URINE, INITIAL: NEGATIVE ug/mL
Opiate Screen, Urine: NEGATIVE ng/mL
Oxycodone Screen, Ur: NEGATIVE ng/mL
PHENCYCLIDINE, UR: NEGATIVE ng/mL
PROPOXYPHENE: NEGATIVE ng/mL
Tapentadol, urine: NEGATIVE ng/mL
Tramadol Scrn, Ur: NEGATIVE ng/mL
ZOLPIDEM, URINE: NEGATIVE ng/mL
pH, Initial: 6.1 pH (ref 4.5–8.9)

## 2013-06-12 NOTE — Progress Notes (Addendum)
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  She went home and she and her boyfriend made threatening phone calls to the office, demanding to return next day (06/12/13) to talk to the physician to remove this diagnosis. They were informed that the diagnosis will not be removed given positive screening and confirmatory testing; and the boyfriend reportedly threatened that if the diagnosis was not removed "it's going to do go down".  The [place] police were notified of this threat to office staff; and Dr. Marice Potter (MD in clinic on 06/12/13) was also notified.  Given that the patient and her boyfriend are making threats against our office staff, it is possible that she will be dismissed from our practice and she will be contacted with a dismissal notice.

## 2013-06-12 NOTE — Progress Notes (Signed)
Ms. Bringer is here without her boyfriend today so that she can discuss her + drug screen. Her UDS from yesterday was negative. I explained that I have no control over removing a + drug screen result from her chart. I also offered to do UDS via catheter specimen at her desire. She agreed to this plan. Of note, exam of her mouth reveals extremely poor dentition. At her next visit, she should get a referral to a dentist.

## 2013-07-08 ENCOUNTER — Encounter (HOSPITAL_COMMUNITY): Payer: Self-pay

## 2013-07-14 ENCOUNTER — Ambulatory Visit (INDEPENDENT_AMBULATORY_CARE_PROVIDER_SITE_OTHER): Payer: Medicaid Other | Admitting: Obstetrics & Gynecology

## 2013-07-14 ENCOUNTER — Encounter: Payer: Self-pay | Admitting: Obstetrics & Gynecology

## 2013-07-14 VITALS — BP 116/68 | HR 96 | Wt 187.4 lb

## 2013-07-14 DIAGNOSIS — Z23 Encounter for immunization: Secondary | ICD-10-CM

## 2013-07-14 DIAGNOSIS — O34219 Maternal care for unspecified type scar from previous cesarean delivery: Secondary | ICD-10-CM

## 2013-07-14 DIAGNOSIS — N390 Urinary tract infection, site not specified: Secondary | ICD-10-CM

## 2013-07-14 DIAGNOSIS — Z3480 Encounter for supervision of other normal pregnancy, unspecified trimester: Secondary | ICD-10-CM

## 2013-07-14 LAB — CBC
HCT: 30.8 % — ABNORMAL LOW (ref 36.0–46.0)
Hemoglobin: 11 g/dL — ABNORMAL LOW (ref 12.0–15.0)
MCH: 33.6 pg (ref 26.0–34.0)
MCHC: 35.7 g/dL (ref 30.0–36.0)
MCV: 94.2 fL (ref 78.0–100.0)
PLATELETS: 226 10*3/uL (ref 150–400)
RBC: 3.27 MIL/uL — ABNORMAL LOW (ref 3.87–5.11)
RDW: 13.6 % (ref 11.5–15.5)
WBC: 7.1 10*3/uL (ref 4.0–10.5)

## 2013-07-14 LAB — POCT URINALYSIS DIPSTICK
BILIRUBIN UA: NEGATIVE
GLUCOSE UA: NEGATIVE
Ketones, UA: NEGATIVE
Nitrite, UA: NEGATIVE
Urobilinogen, UA: NEGATIVE
pH, UA: 6.5

## 2013-07-14 MED ORDER — NITROFURANTOIN MONOHYD MACRO 100 MG PO CAPS: 100.0000 mg | ORAL_CAPSULE | Freq: Two times a day (BID) | ORAL | Status: AC

## 2013-07-14 NOTE — Progress Notes (Signed)
Routine visit. UDS from last month was not a cath specimen. We will get a cath specimen today. Dental referral today. No problems. TDAP, gluocla, and labs today. Macrobid for UTI. UC&S sent.

## 2013-07-15 LAB — DRUG SCREEN, URINE
AMPHETAMINE SCRN UR: NEGATIVE
Barbiturate Quant, Ur: NEGATIVE
Benzodiazepines.: NEGATIVE
Cocaine Metabolites: NEGATIVE
Creatinine,U: 266.89 mg/dL
MARIJUANA METABOLITE: NEGATIVE
METHADONE: NEGATIVE
Opiates: NEGATIVE
PROPOXYPHENE: NEGATIVE
Phencyclidine (PCP): NEGATIVE

## 2013-07-15 LAB — HIV ANTIBODY (ROUTINE TESTING W REFLEX): HIV: NONREACTIVE

## 2013-07-15 LAB — RPR

## 2013-07-16 ENCOUNTER — Ambulatory Visit (HOSPITAL_COMMUNITY)
Admission: RE | Admit: 2013-07-16 | Discharge: 2013-07-16 | Disposition: A | Discharge status: Home / Self Care | Payer: Medicaid Other | Source: Ambulatory Visit | Attending: Obstetrics & Gynecology | Admitting: Obstetrics & Gynecology

## 2013-07-16 ENCOUNTER — Encounter: Payer: Self-pay | Admitting: Obstetrics & Gynecology

## 2013-07-16 ENCOUNTER — Other Ambulatory Visit (HOSPITAL_COMMUNITY): Payer: Self-pay | Admitting: Maternal and Fetal Medicine

## 2013-07-16 VITALS — BP 105/63 | HR 102 | Wt 186.2 lb

## 2013-07-16 DIAGNOSIS — O09299 Supervision of pregnancy with other poor reproductive or obstetric history, unspecified trimester: Secondary | ICD-10-CM | POA: Insufficient documentation

## 2013-07-16 DIAGNOSIS — IMO0002 Reserved for concepts with insufficient information to code with codable children: Secondary | ICD-10-CM

## 2013-07-16 DIAGNOSIS — O99322 Drug use complicating pregnancy, second trimester: Secondary | ICD-10-CM

## 2013-07-16 DIAGNOSIS — F1410 Cocaine abuse, uncomplicated: Secondary | ICD-10-CM

## 2013-07-16 DIAGNOSIS — O99331 Smoking (tobacco) complicating pregnancy, first trimester: Secondary | ICD-10-CM

## 2013-07-16 DIAGNOSIS — O99320 Drug use complicating pregnancy, unspecified trimester: Principal | ICD-10-CM

## 2013-07-16 DIAGNOSIS — O99330 Smoking (tobacco) complicating pregnancy, unspecified trimester: Secondary | ICD-10-CM | POA: Insufficient documentation

## 2013-07-16 DIAGNOSIS — F1920 Other psychoactive substance dependence, uncomplicated: Secondary | ICD-10-CM | POA: Insufficient documentation

## 2013-07-16 DIAGNOSIS — O359XX0 Maternal care for (suspected) fetal abnormality and damage, unspecified, not applicable or unspecified: Secondary | ICD-10-CM | POA: Insufficient documentation

## 2013-07-16 LAB — CULTURE, OB URINE: Colony Count: 85000

## 2013-07-23 ENCOUNTER — Ambulatory Visit (HOSPITAL_COMMUNITY): Admission: RE | Admit: 2013-07-23 | Payer: Medicaid Other | Source: Ambulatory Visit

## 2013-07-30 ENCOUNTER — Ambulatory Visit (HOSPITAL_COMMUNITY): Payer: Medicaid Other | Attending: Obstetrics & Gynecology

## 2013-08-07 ENCOUNTER — Ambulatory Visit (HOSPITAL_COMMUNITY): Admission: RE | Admit: 2013-08-07 | Payer: Medicaid Other | Source: Ambulatory Visit

## 2013-08-18 ENCOUNTER — Encounter: Payer: Medicaid Other | Admitting: Family Medicine

## 2013-11-17 ENCOUNTER — Encounter: Payer: Self-pay | Admitting: Obstetrics & Gynecology

## 2013-12-30 ENCOUNTER — Emergency Department (HOSPITAL_COMMUNITY)
Admission: EM | Admit: 2013-12-30 | Discharge: 2013-12-30 | Disposition: A | Discharge status: Home / Self Care | Payer: Medicaid Other | Attending: Emergency Medicine | Admitting: Emergency Medicine

## 2013-12-30 ENCOUNTER — Encounter (HOSPITAL_COMMUNITY): Payer: Self-pay | Admitting: Emergency Medicine

## 2013-12-30 DIAGNOSIS — N390 Urinary tract infection, site not specified: Secondary | ICD-10-CM | POA: Diagnosis not present

## 2013-12-30 DIAGNOSIS — Z792 Long term (current) use of antibiotics: Secondary | ICD-10-CM | POA: Diagnosis not present

## 2013-12-30 DIAGNOSIS — Z3202 Encounter for pregnancy test, result negative: Secondary | ICD-10-CM | POA: Insufficient documentation

## 2013-12-30 DIAGNOSIS — Z8659 Personal history of other mental and behavioral disorders: Secondary | ICD-10-CM | POA: Diagnosis not present

## 2013-12-30 DIAGNOSIS — R300 Dysuria: Secondary | ICD-10-CM | POA: Diagnosis present

## 2013-12-30 DIAGNOSIS — G8929 Other chronic pain: Secondary | ICD-10-CM | POA: Diagnosis not present

## 2013-12-30 DIAGNOSIS — Z8742 Personal history of other diseases of the female genital tract: Secondary | ICD-10-CM | POA: Insufficient documentation

## 2013-12-30 DIAGNOSIS — Z8619 Personal history of other infectious and parasitic diseases: Secondary | ICD-10-CM | POA: Insufficient documentation

## 2013-12-30 DIAGNOSIS — Z720 Tobacco use: Secondary | ICD-10-CM | POA: Insufficient documentation

## 2013-12-30 DIAGNOSIS — Z862 Personal history of diseases of the blood and blood-forming organs and certain disorders involving the immune mechanism: Secondary | ICD-10-CM | POA: Insufficient documentation

## 2013-12-30 LAB — URINALYSIS, ROUTINE W REFLEX MICROSCOPIC
Bilirubin Urine: NEGATIVE
GLUCOSE, UA: NEGATIVE mg/dL
KETONES UR: NEGATIVE mg/dL
Nitrite: NEGATIVE
PROTEIN: 100 mg/dL — AB
Specific Gravity, Urine: 1.013 (ref 1.005–1.030)
Urobilinogen, UA: 0.2 mg/dL (ref 0.0–1.0)
pH: 6.5 (ref 5.0–8.0)

## 2013-12-30 LAB — PREGNANCY, URINE: Preg Test, Ur: NEGATIVE

## 2013-12-30 LAB — URINE MICROSCOPIC-ADD ON

## 2013-12-30 MED ORDER — CEPHALEXIN 500 MG PO CAPS: 500.0000 mg | ORAL_CAPSULE | Freq: Four times a day (QID) | ORAL | Status: AC

## 2013-12-30 MED ORDER — PHENAZOPYRIDINE HCL 200 MG PO TABS: 200.0000 mg | ORAL_TABLET | Freq: Three times a day (TID) | ORAL | Status: AC

## 2013-12-30 NOTE — Discharge Instructions (Signed)
You were evaluated in the ED today for your urinary symptoms. You were found to have a UTI. Please take her antibiotic as prescribed for your symptoms. You may also take the Pyridium to help with the burning associated with your UTI. Please follow-up with primary care and/or [place] and wellness for further evaluation and management of your symptoms. Return to ED for worsening symptoms.   Emergency Department Resource Guide 1) Find a Doctor and Pay Out of Pocket Although you won't have to find out who is covered by your insurance plan, it is a good idea to ask around and get recommendations. You will then need to call the office and see if the doctor you have chosen will accept you as a new patient and what types of options they offer for patients who are self-pay. Some doctors offer discounts or will set up payment plans for their patients who do not have insurance, but you will need to ask so you aren't surprised when you get to your appointment.  2) Contact Your Local Health Department Not all health departments have doctors that can see patients for sick visits, but many do, so it is worth a call to see if yours does. If you don't know where your local health department is, you can check in your phone book. The CDC also has a tool to help you locate your state's health department, and many state websites also have listings of all of their local health departments.  3) Find a Walk-in Clinic If your illness is not likely to be very severe or complicated, you may want to try a walk in clinic. These are popping up all over the country in pharmacies, drugstores, and shopping centers. They're usually staffed by nurse practitioners or physician assistants that have been trained to treat common illnesses and complaints. They're usually fairly quick and inexpensive. However, if you have serious medical issues or chronic medical problems, these are probably not your best option.  No Primary Care  Doctor: - Call Health Connect at  [phone] - they can help you locate a primary care doctor that  accepts your insurance, provides certain services, etc. - Physician Referral Service- [phone]  Chronic Pain Problems: Organization         Address  Phone   Notes  [facility]  [phone]) [phone] Patients need to be referred by their primary care doctor.   Medication Assistance: Organization         Address  Phone   Notes  [facility] Medication [facility] [address] [place], [place] [address] [phone]) [phone] --Must be a resident of [place] -- Must have NO insurance coverage whatsoever (no Medicaid/ Medicare, etc.) -- The pt. MUST have a primary care doctor that directs their care regularly and follows them in the community   MedAssist  [phone]) [phone]   [employer]  [phone]) [phone]    Agencies that provide inexpensive medical care: Organization         Address  Phone   Notes  [facility]  [phone]   [facility]    [phone]) [phone]   [facility] [address] [place], [place] [address] [phone]) [phone]   [facility] [address], [place] [phone]) [phone]   [facility]anned Parenthood    [phone]) [phone]   [facility]    [phone]) [phone]   [facility]  [address], [place] Phone:  [phone], Fax:  [phone] Hours of Operation:  9 am - 6 pm, M-F.  Also accepts Medicaid/Medicare and self-pay.  [facility] for [place] [address], [address], [place] Phone: [phone], Fax: [phone]. Hours of Operation:  8:30 am - 5:30 pm, M-F.  Also accepts Medicaid and self-pay.  [facility] [place] [address], [place] Phone: [phone]   [place], [place], [place] [phone], Ext. [phone] Mondays & Thursdays: 7-9 AM.  First 15 patients are seen on a first  come, first serve basis.    [place] Providers:  Organization         Address  Phone   Notes  [facility] [address], [place] [phone] Also accepts self-pay patients.  [facility] [address], [place]  [phone]   [place], [address], [place] [phone]   [facility] [address], [place] [phone]   Lucianne Lei [address], [place]   [phone] Only accepts [place] Access [place] patients after they have their name applied to their card.   Self-Pay (no insurance) in [facility]:  Organization         Address  Phone   Notes  Sickle Cell Patients, [facility] [place] [phone]   [facility] [place] [phone]   [facility] [place]  [place], [place], [place] [phone]   [facility]/Dr. Osei-Bonsu  [address], [place] or [address], [place] [phone] Phone number for both [place] and [place] locations is the same.  Urgent Medical and [facility] [address], [place] [phone]   [facility] [address], [place] or [address] [phone]   [facility] [address], [place] [phone], phone; [phone], fax Sees patients 1st and 3rd Saturday of every month.  Must not qualify for public or private insurance (i.e. Medicaid, Medicare, [place] Health Choice, Veterans' Benefits)  Household income should be no more than 200% of the poverty level The clinic cannot treat you if you are pregnant or think you are pregnant  Sexually transmitted diseases are not treated at the clinic.    Dental Care: Organization          Address  Phone  Notes  [facility] of [facility] [place] [phone] Accepts children up to age 13 who are enrolled in [place] or [place]; pregnant women with a Medicaid card; and children who have applied for Medicaid or [place] Health Choice, but were declined, whose parents can pay a reduced fee at time of service.  [facility] Department of [facility]  [address], [place] [phone] Accepts children up to age 8 who are enrolled in [place] or [place]; pregnant women with a Medicaid card; and children who have applied for Medicaid or [place] Health Choice, but were declined, whose parents can pay a reduced fee at time of service.  [facility] Adult Dental Access PROGRAM  [place] [phone] Patients are  seen by appointment only. Walk-ins are not accepted. [place] will see patients 20 years of age and older. Monday - Tuesday (8am-5pm) Most Wednesdays (8:30-5pm) $30 per visit, cash only  [facility] Adult Dental Access PROGRAM  [address], [facility] [phone] Patients are seen by appointment only. Walk-ins are not accepted. [place] will see patients 57 years of age and older. One Wednesday Evening (Monthly: Volunteer Based).  $30 per visit, cash only  [place]  [phone] for adults; Children under age 71, call Graduate Pediatric Dentistry at [phone]. Children aged 38-14, please call [phone] to request a pediatric application.  Dental services are provided in all areas of dental care including fillings, crowns and bridges, complete and partial dentures, implants, gum treatment, root canals, and extractions. Preventive care is also provided. Treatment is provided to both adults and children. Patients are selected via a lottery and there is often a waiting list.   [facility] [address], [place]  [phone] [email or web address]   [facility] [address] [place], [place] [phone], Ext. [phone] Second and Fourth Thursday of each month, opens at 6:30 AM; Clinic ends at 9 AM.  Patients are seen on a first-come first-served basis, and a limited number are seen during each clinic.   [facility]  [address] Hillard Danker [place], [place] [phone]   Eligibility Requirements You must have lived in [place], [place], or [place] counties for at least the last three months.   You cannot be eligible for state or federal sponsored [employer], including [employer], [place], or [employer].   You generally cannot be eligible for healthcare insurance through your employer.    How to apply: Eligibility screenings are held every Tuesday and Wednesday afternoon from 1:00 pm until 4:00 pm. You do not need an appointment for the interview!  [facility] [address] [place], [place]   [place]  [place] Department  [place]  [phone]    Behavioral Health Resources in the Community: Intensive Outpatient Programs Organization         Address  Phone  Notes  [place]. [address] [place], [place] [phone]   [facility] [address], [place], [place]   ADS: Alcohol & Drug S[facility] [address] [place], [place]   [place] [address]  [place], [place] or [phone]   Substance Abuse Resources Organization         Address  Phone  Notes  Alcohol and Drug Services  [phone]   [place]  [phone]   [facility] [place]   Chinita Pester  [phone]   Residential & Outpatient Substance Abuse Program  [phone]   Psychological  Services Organization         Address  Phone  Notes  [facility] [place]  [place]  [phone]   [place] [address] [place] or [phone]    Mobile Crisis Teams Organization         Address  Phone  Notes  [facility] Alternatives, Mobile Crisis Care Unit  [phone]   Assertive Psychotherapeutic Services  [address] [place], [place]   [facility] [address] [place]  [place] [phone]    Self-Help/Support Groups Organization         Address  Phone             Notes  [facility] [place] - variety of support groups  [place] Call for more information  Narcotics Anonymous (NA), [facility] [address], [employer] [place]  2 meetings at this location   Special educational needs teacher         Address  Phone  Notes  ASAP Residential Treatment [place],    [place]  [phone]   [facility]  [address], [place] [address]8978, [place], [place]   [place], [place] [phone] Admissions: 8am-3pm M-F  Incentives Substance [place] [address],    [place], [place] [phone]   [facility] [address] [place], [place], [place]   [facility] [address],  [place], [place]   [facility] Programs - Intensive Outpatient [address], [place], [place]   [facility] ([place] [address].,  [place], [place] [phone] or [phone]   [facility]idential Treatment Services (RTS) [address], [place], [place] Accepts Medicaid  [facility] [place] [address],  [place] [phone] Substance Abuse/Addiction Treatment   [facility] Organization         Address  Phone  Notes  [facility]  [phone]   Domenic Schwab, PhD [address] Arlis [place], [place]   [phone] or [phone]   [place], [place] [phone]   [facility] [address], [place], [place] [phone] Insurance/Medicaid/sponsorship through [facility] and Families [address]                                    [place], [place] [phone] [place] [address], [place] [phone]    Dr. Adele Schilder  [phone]   [facility] Dept. 1) [address] [place] 2) [place] 3)  [place] [address], [place] [phone]  [phone]   [place] [phone] or [phone] (After Hours)

## 2013-12-30 NOTE — ED Provider Notes (Signed)
CSN: [id number]     Arrival date & time 12/30/13  0031 History   First MD Initiated Contact with Patient 12/30/13 0048     Chief Complaint  Patient presents with  . Dysuria  . Back Pain     (Consider location/radiation/quality/duration/timing/severity/associated sxs/prior Treatment) HPI Carol Abbott is a 23 y.o. female with history of chronic back pain and UTI comes in for evaluation of current UTI. Patient states for the past 2 days she has had a UTI with associated dysuria, frequency, urgency. She denies fevers, hematuria, flank pain, vaginal discharge. She has tried Azo, cranberry juice and water without relief of her symptoms. There are no other aggravating or relieving factors. She requests a note for work due to her UTI. Last menstrual period last week, normal in duration and flow, still spotting now.  Past Medical History  Diagnosis Date  . PONV (postoperative nausea and vomiting)   . Spinal headache   . Infection   . Urinary tract infection   . Bacterial vaginosis   . Ovarian cyst 2008  . Anxiety   . Anemia    Past Surgical History  Procedure Laterality Date  . Cesarean section     Family History  Problem Relation Age of Onset  . Anesthesia problems Neg Hx   . Hypotension Neg Hx   . Malignant hyperthermia Neg Hx   . Pseudochol deficiency Neg Hx   . Birth defects Daughter     Joellyn QuailsDandy Walker malformation  . Seizures Daughter   . Developmental delay Daughter    History  Substance Use Topics  . Smoking status: Current Every Day Smoker -- 0.50 packs/day    Types: Cigarettes  . Smokeless tobacco: Never Used  . Alcohol Use: No   OB History    Gravida Para Term Preterm AB TAB SAB Ectopic Multiple Living   2 1 1       1      Review of Systems  Constitutional: Negative for fever and chills.  Genitourinary: Positive for dysuria, urgency and frequency. Negative for hematuria, vaginal discharge and pelvic pain.  All other systems reviewed and are  negative.     Allergies  Review of patient's allergies indicates no known allergies.  Home Medications   Prior to Admission medications   Medication Sig Start Date End Date Taking? Authorizing Provider  cephALEXin (KEFLEX) 500 MG capsule Take 1 capsule (500 mg total) by mouth 4 (four) times daily. 12/30/13   Earle GellBenjamin W Carlean Crowl, PA-C  nicotine (NICODERM CQ - DOSED IN MG/24 HOURS) 21 mg/24hr patch Place 1 patch (21 mg total) onto the skin daily. 04/30/13   Tereso NewcomerUgonna A Anyanwu, MD  nitrofurantoin, macrocrystal-monohydrate, (MACROBID) 100 MG capsule Take 1 capsule (100 mg total) by mouth 2 (two) times daily. 07/14/13   Allie BossierMyra C Dove, MD  phenazopyridine (PYRIDIUM) 200 MG tablet Take 1 tablet (200 mg total) by mouth 3 (three) times daily. 12/30/13   Sharlene MottsBenjamin W Myrl Lazarus, PA-C  prenatal vitamin w/FE, FA (PRENATAL 1 + 1) 27-1 MG TABS Take 1 tablet by mouth daily.      Historical Provider, MD  SUMAtriptan (IMITREX) 100 MG tablet Take 1 tablet (100 mg total) by mouth once as needed for migraine or headache. May repeat in 2 hours if headache persists or recurs. 06/11/13   Tereso NewcomerUgonna A Anyanwu, MD   BP 116/66 mmHg  Pulse 80  Temp(Src) 98.3 F (36.8 C) (Oral)  Resp 18  Ht 5\' 7"  (1.702 m)  Wt 170 lb 11.2 oz (  77.429 kg)  BMI 26.73 kg/m2  SpO2 100%  LMP 12/22/2013  Breastfeeding? Unknown Physical Exam  Constitutional: She is oriented to person, place, and time. She appears well-developed and well-nourished.  HENT:  Head: Normocephalic and atraumatic.  Mouth/Throat: Oropharynx is clear and moist.  Eyes: Conjunctivae are normal. Pupils are equal, round, and reactive to light. Right eye exhibits no discharge. Left eye exhibits no discharge. No scleral icterus.  Neck: Neck supple.  Cardiovascular: Normal rate, regular rhythm and normal heart sounds.   Pulmonary/Chest: Effort normal and breath sounds normal. No respiratory distress. She has no wheezes. She has no rales.  Abdominal: Soft.  Mild tenderness to  suprapubic region. Abdomen otherwise soft, nontender with no distention  Musculoskeletal: Normal range of motion. She exhibits no edema or tenderness.  Neurological: She is alert and oriented to person, place, and time.  Cranial Nerves II-XII grossly intact  Skin: Skin is warm and dry. No rash noted.  Psychiatric: She has a normal mood and affect.  Nursing note and vitals reviewed.   ED Course  Procedures (including critical care time) Labs Review Labs Reviewed  URINALYSIS, ROUTINE W REFLEX MICROSCOPIC - Abnormal; Notable for the following:    APPearance CLOUDY (*)    Hgb urine dipstick LARGE (*)    Protein, ur 100 (*)    Leukocytes, UA LARGE (*)    All other components within normal limits  URINE MICROSCOPIC-ADD ON - Abnormal; Notable for the following:    Squamous Epithelial / LPF FEW (*)    Bacteria, UA FEW (*)    All other components within normal limits  PREGNANCY, URINE    Imaging Review No results found.   EKG Interpretation None     Meds given in ED:  Medications - No data to display  Discharge Medication List as of 12/30/2013  1:55 AM    START taking these medications   Details  cephALEXin (KEFLEX) 500 MG capsule Take 1 capsule (500 mg total) by mouth 4 (four) times daily., Starting 12/30/2013, Until Discontinued, Print    phenazopyridine (PYRIDIUM) 200 MG tablet Take 1 tablet (200 mg total) by mouth 3 (three) times daily., Starting 12/30/2013, Until Discontinued, Print       Filed Vitals:   12/30/13 0034 12/30/13 0159  BP: 119/75 116/66  Pulse: 87 80  Temp: 98.3 F (36.8 C) 98.3 F (36.8 C)  TempSrc: Oral Oral  Resp: 20 18  Height: 5\' 7"  (1.702 m)   Weight: 170 lb 11.2 oz (77.429 kg)   SpO2: 98% 100%    MDM  Vitals stable - WNL -afebrile Pt resting comfortably in ED. PE--not concerning further acute or emergent pathology. There is no flank pain, CVA tenderness. Benign abdominal exam. Mild suprapubic tenderness which correlates with UA findings  of UTI. Will treat empirically  DC Keflex and pyridium. Discussed f/u with PCP and return precautions, pt very amenable to plan. Requests work note Patient stable, in good condition and is appropriate for discharge   Final diagnoses:  UTI (lower urinary tract infection)        Sharlene MottsBenjamin W Tayton Decaire, PA-C 12/30/13 1458  Linwood DibblesJon Knapp, MD 12/31/13 [phone]

## 2013-12-30 NOTE — ED Notes (Signed)
Pt. reports dysuria , bladder pressure and urinary frequency for several days , denies fever or chills . Pt. also stated chronic low back pain for several years , denies fall or injury.

## 2014-06-22 IMAGING — CR DG ANKLE COMPLETE 3+V*R*
3 series · 3 of 3 positions shown · non-contrast
Comparison: 09/17/2009

CLINICAL DATA: Fall, ankle pain, remote distal tibial fracture.

EXAM:
RIGHT ANKLE - COMPLETE 3+ VIEW

[view not recorded (1 of 3)]
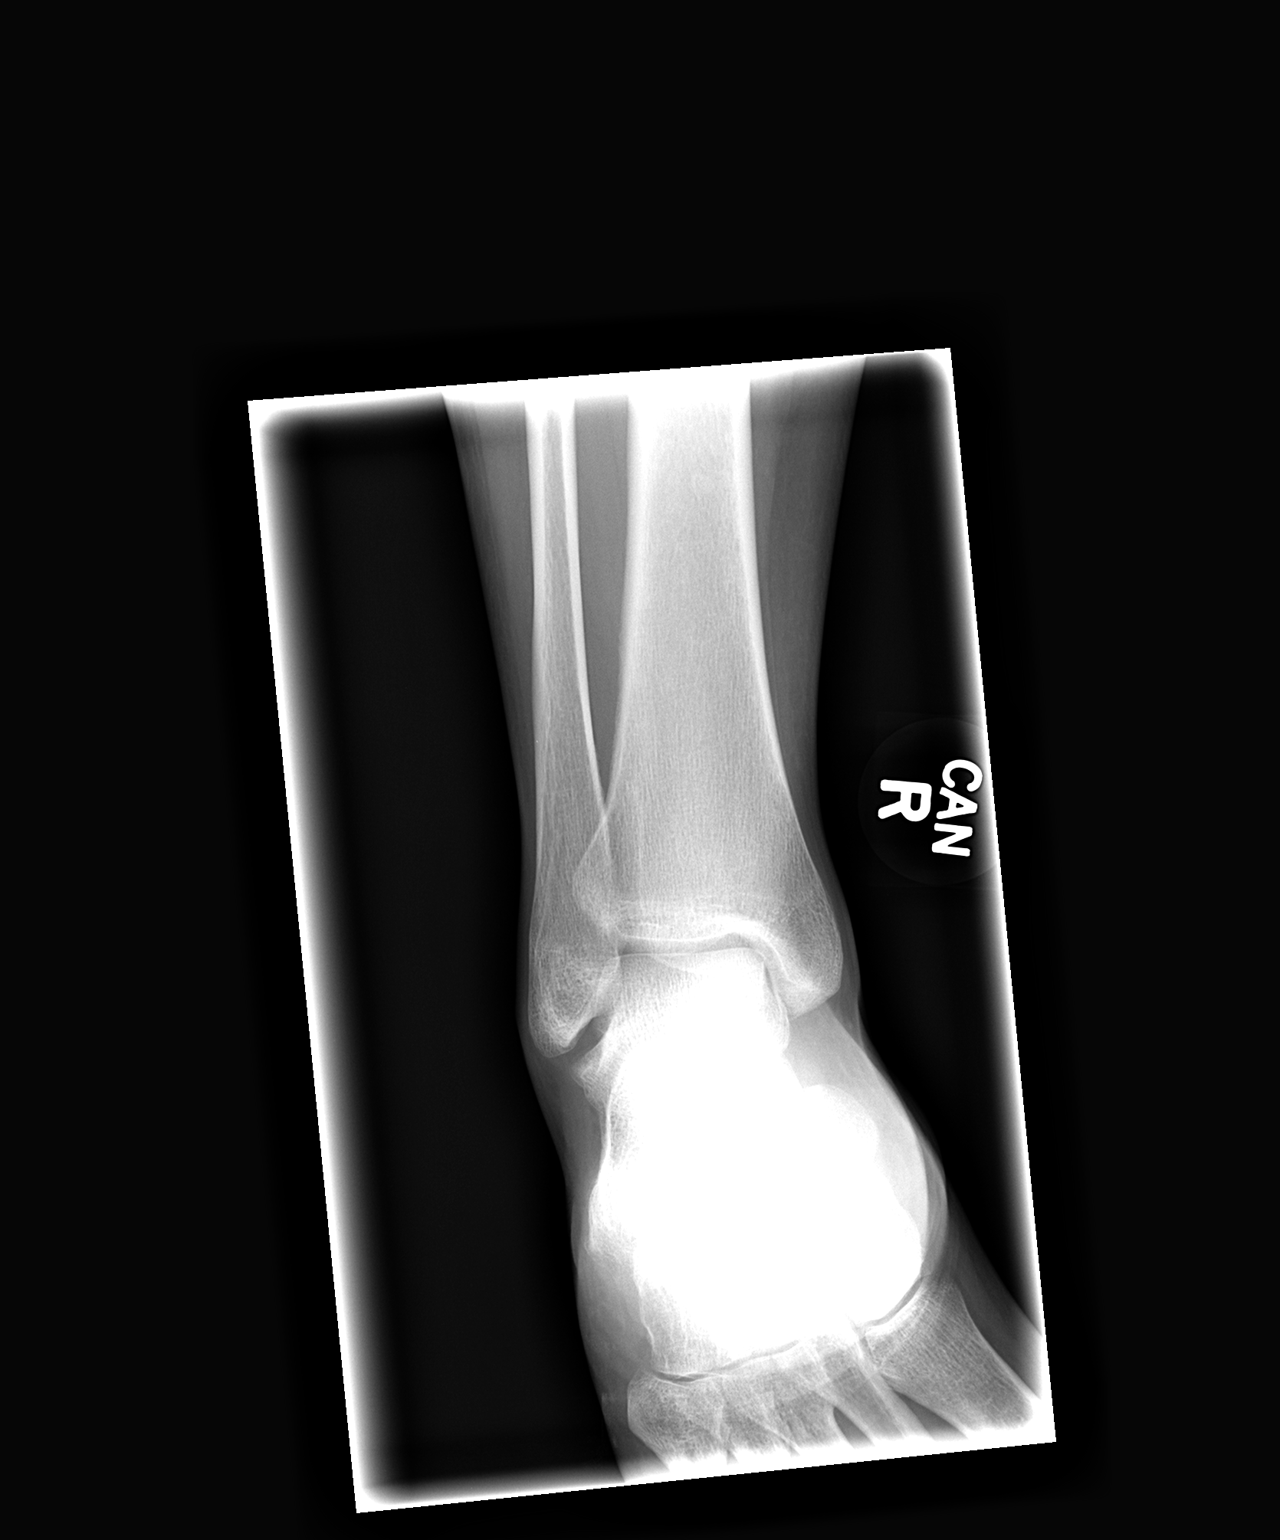

[view not recorded (2 of 3)]
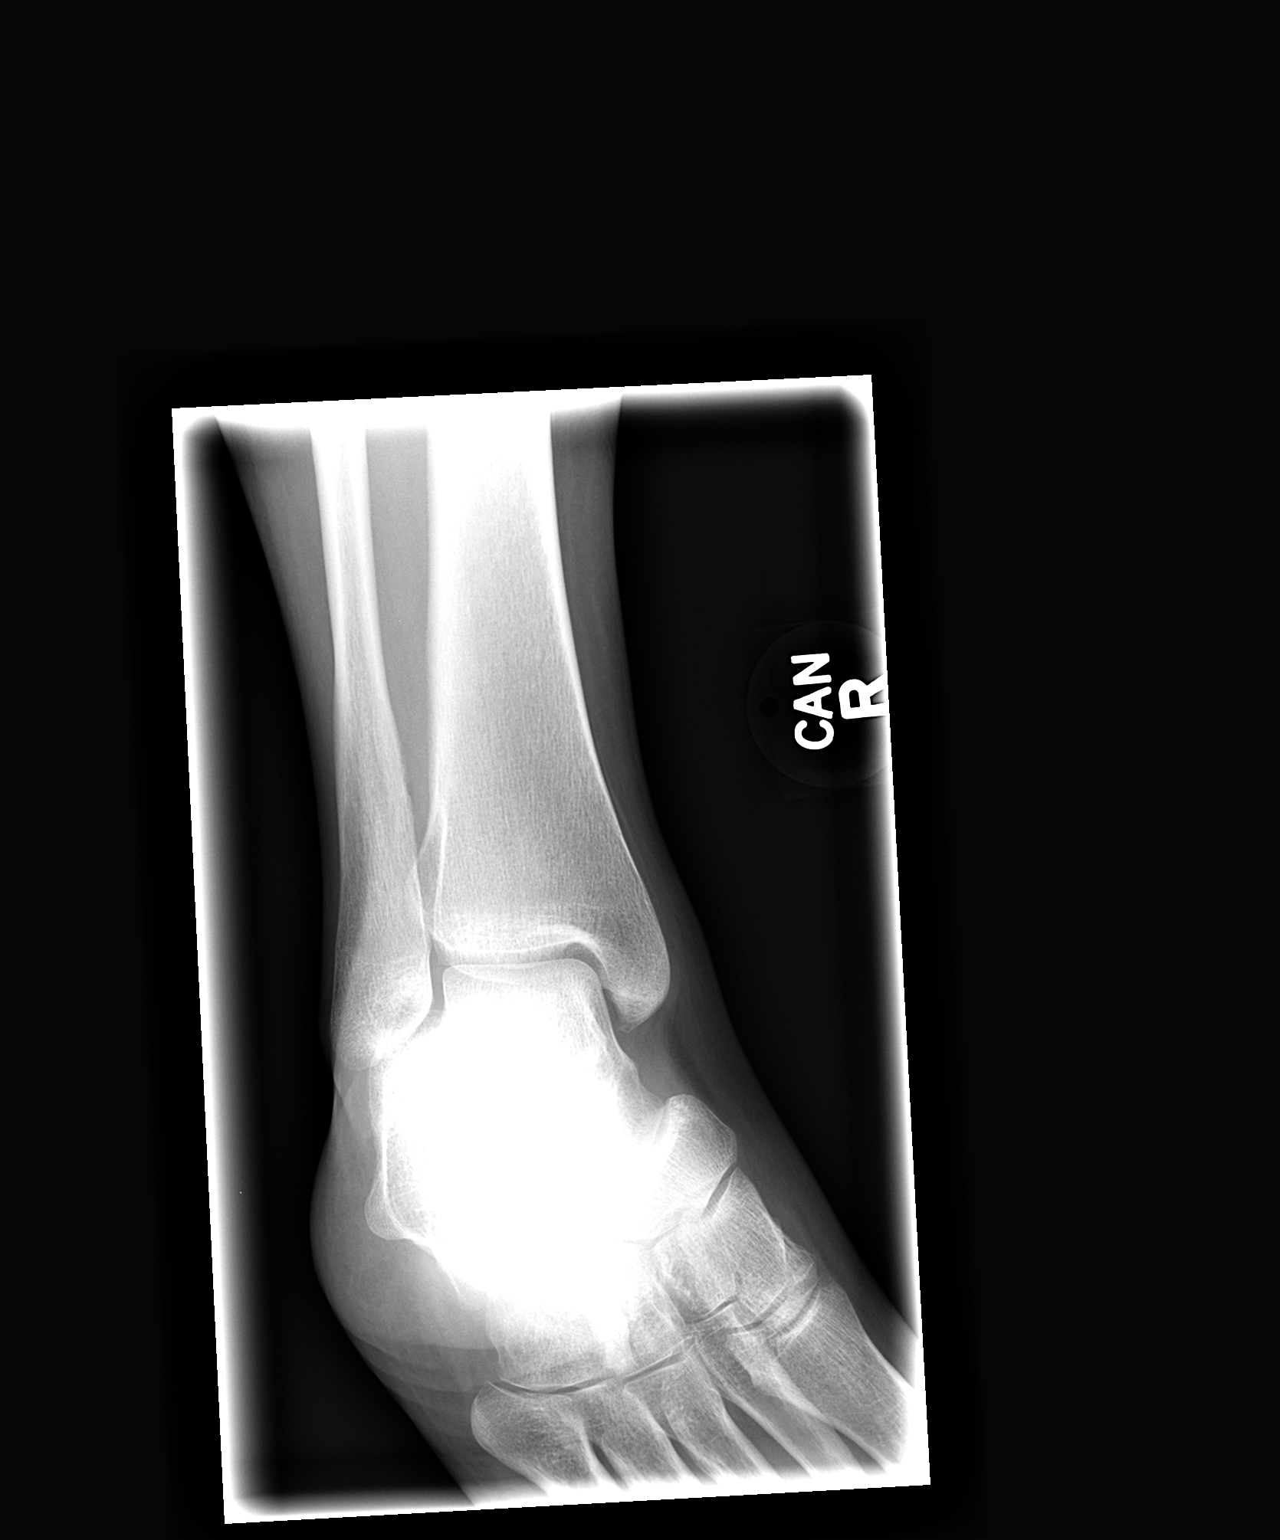

[view not recorded (3 of 3)]
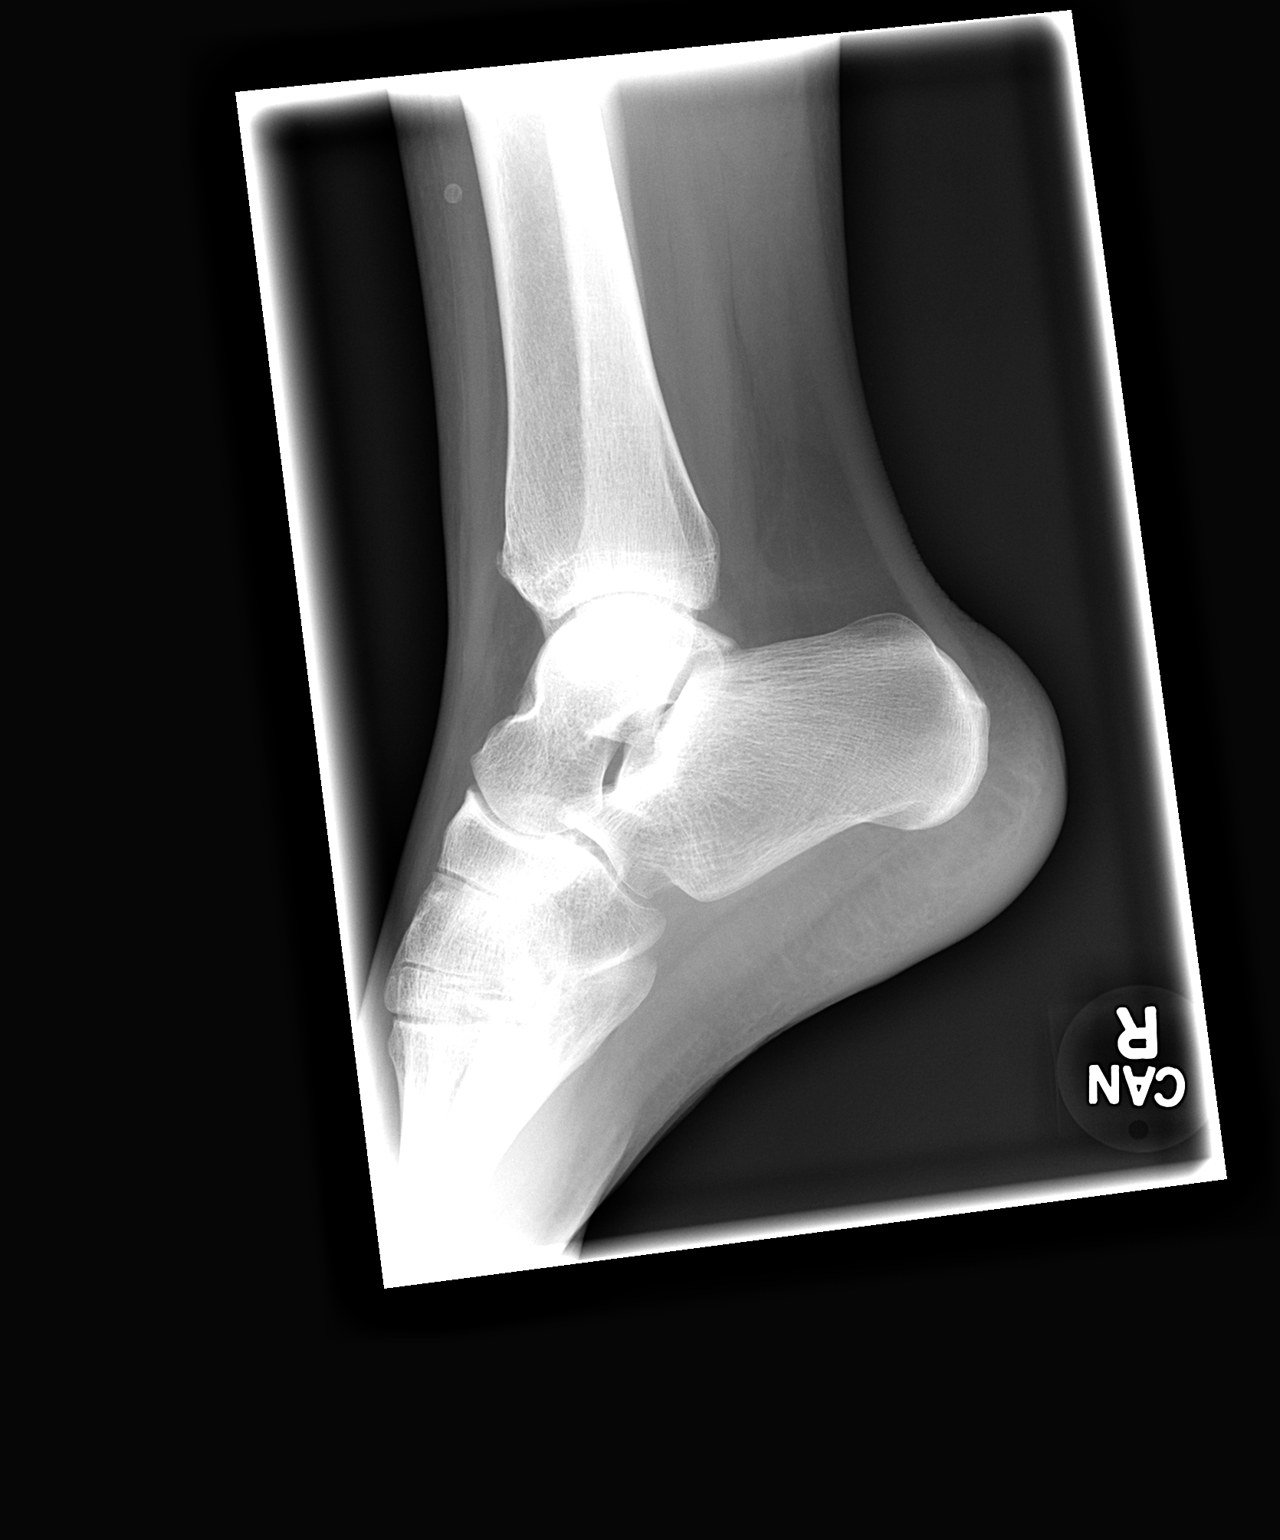

[3 of 3 positions shown; findings below may reference images not displayed]

FINDINGS: Ankle mortise intact. The talar dome is normal. No malleolar
fracture. The calcaneus is normal. Small cortical irregularity along
the anterior distal tibia at site of prior incomplete fracture.
IMPRESSION: No acute osseous abnormality.

## 2015-01-09 IMAGING — US US OB DETAIL+14 WK
1 series · 12 of 28 positions shown · non-contrast
Comparison: none

[Series 1: us ob detail+14 wk · 0.17mm/px · 86 acquisitions, 12 frames shown]
[im 4/86]
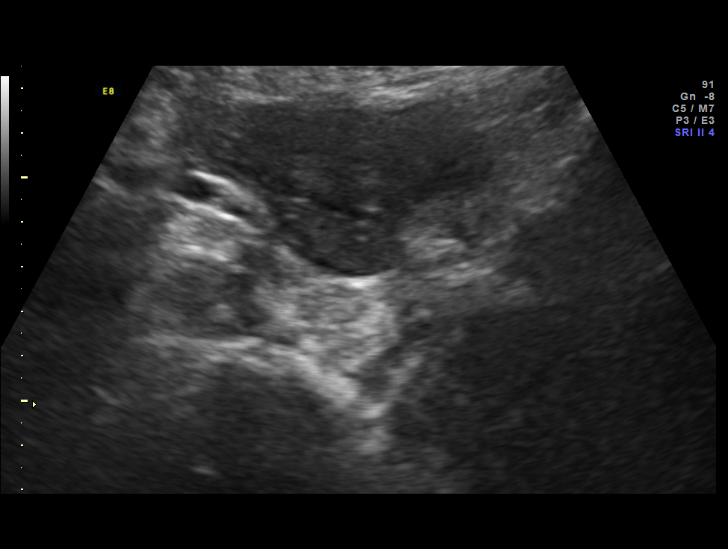
[im 10/86]
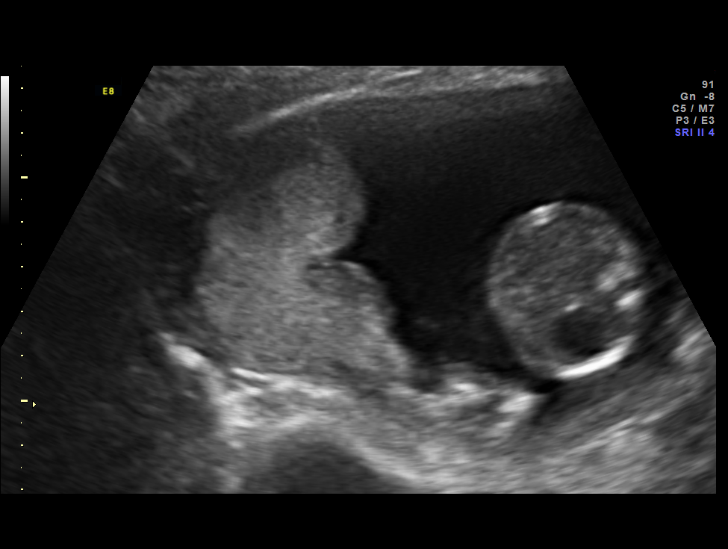
[im 16/86]
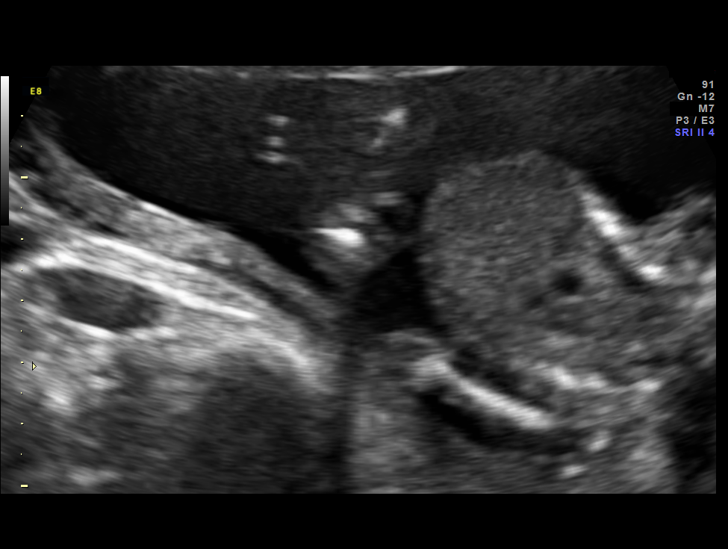
[im 26/86]
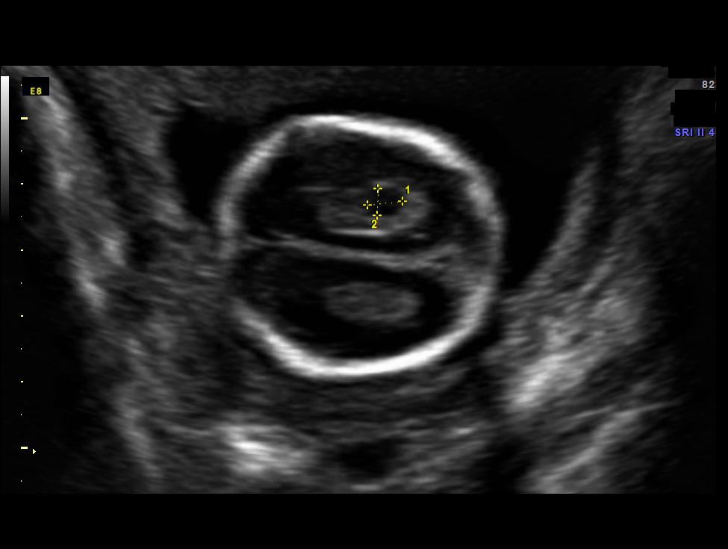
[im 32/86]
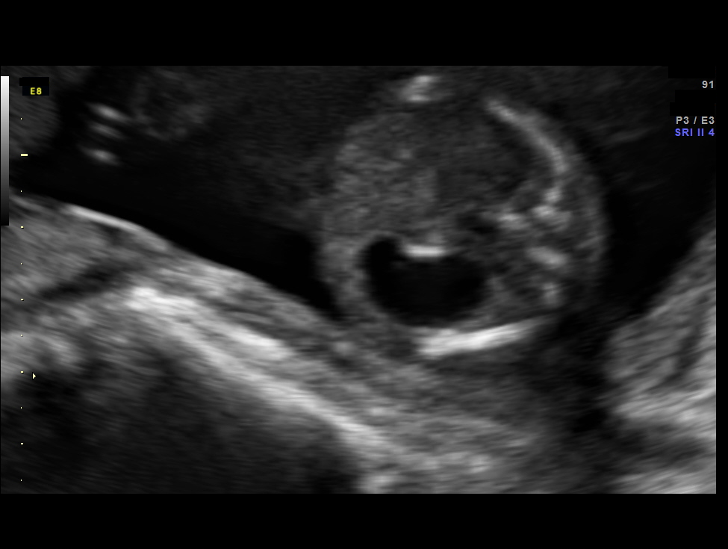
[im 38/86]
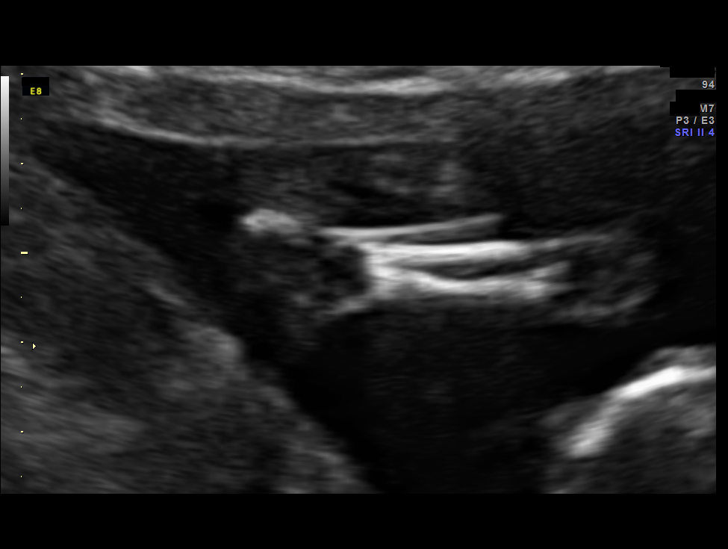
[im 48/86]
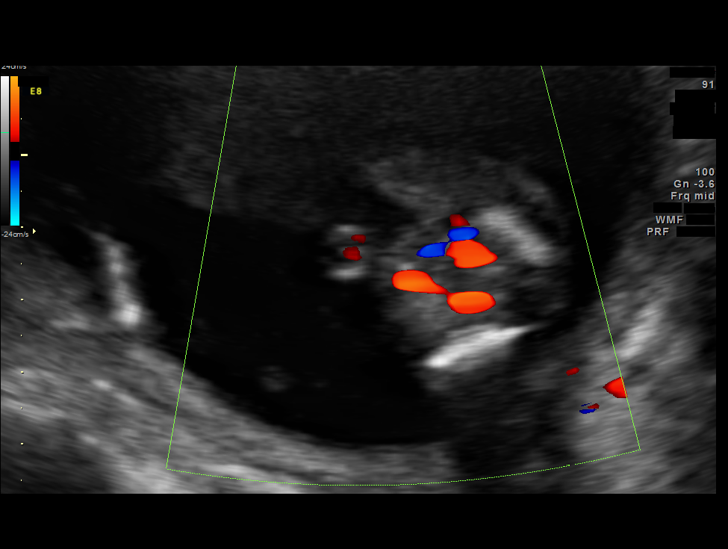
[im 54/86]
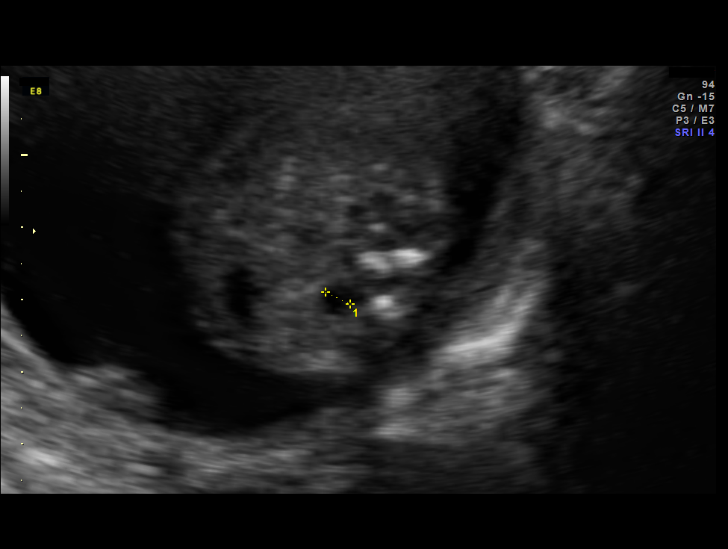
[im 60/86]
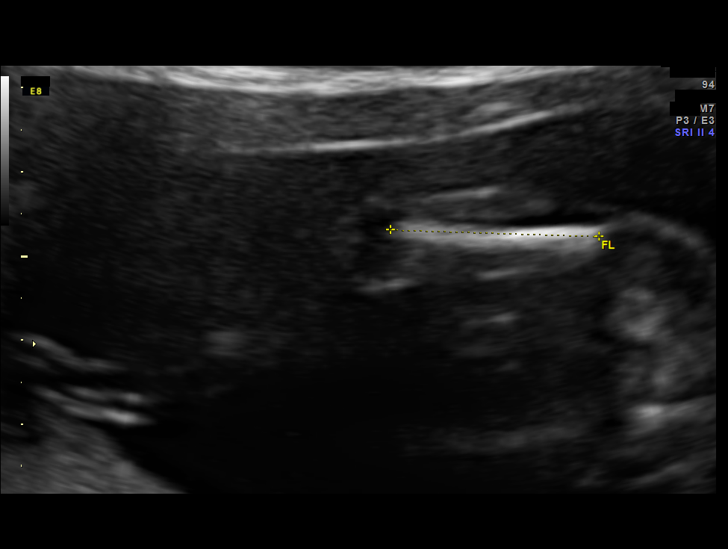
[im 70/86]
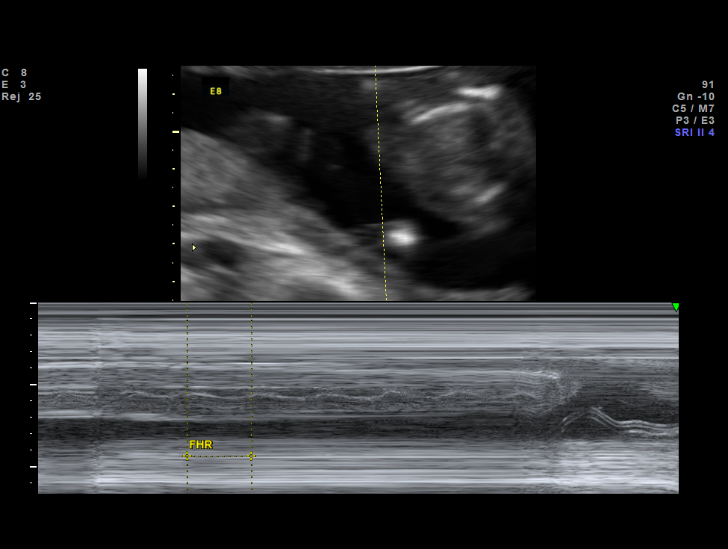
[im 76/86]
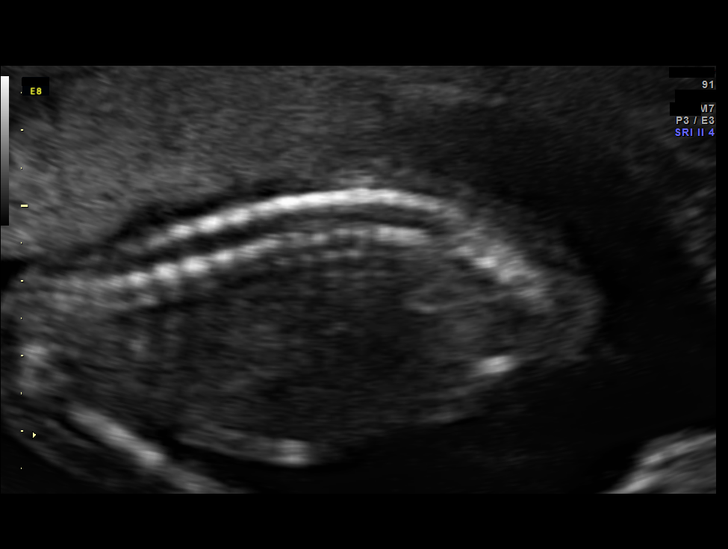
[im 82/86]
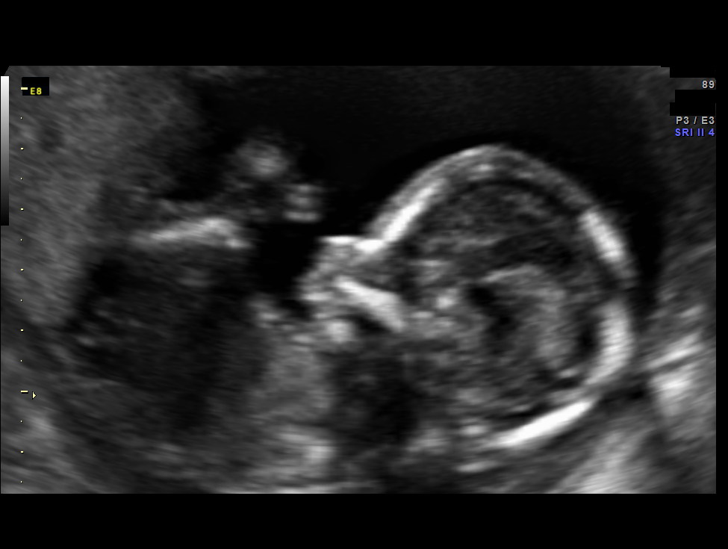

[12 of 28 positions shown; findings below may reference images not displayed]

OBSTETRICS REPORT
                      (Signed Final 05/08/2013 [DATE])

Service(s) Provided

 US OB DETAIL + 14 WK                                  76811.0
Indications

 Detailed fetal anatomic survey
 History of genetic / anatomic abnormality -
 daughter with Dandy-Walker
 Cocaine abuse
 Cigarette smoker
Fetal Evaluation

 Num Of Fetuses:    1
 Fetal Heart Rate:  157                          bpm
 Cardiac Activity:  Observed
 Presentation:      Cephalic
 Placenta:          Posterior Right, above
                    cervical os
 P. Cord            Visualized
 Insertion:

 Amniotic Fluid
 AFI FV:      Subjectively within normal limits
                                             Larg Pckt:     4.5  cm
Biometry

 BPD:     40.9  mm     G. Age:  18w 3d                CI:         79.6   70 - 86
 OFD:     51.4  mm                                    FL/HC:      16.1   16.1 -

 HC:     148.5  mm     G. Age:  18w 0d       17  %    HC/AC:      1.15   1.09 -

 AC:     128.7  mm     G. Age:  18w 3d       42  %    FL/BPD:
 FL:      23.9  mm     G. Age:  17w 1d        7  %    FL/AC:      18.6   20 - 24
 HUM:       25  mm     G. Age:  17w 6d       31  %
 CER:     18.2  mm     G. Age:  18w 1d       34  %

 Est. FW:     214  gm      0 lb 8 oz     32  %
Gestational Age

 LMP:           18w 4d        Date:  12/29/12                 EDD:   10/05/13
 U/S Today:     18w 0d                                        EDD:   10/09/13
 Best:          18w 4d     Det. By:  LMP  (12/29/12)          EDD:   10/05/13
Anatomy

 Cranium:          Appears normal         Aortic Arch:      Appears normal
 Fetal Cavum:      Appears normal         Ductal Arch:      Not well visualized
 Ventricles:       Appears normal         Diaphragm:        Appears normal
 Choroid Plexus:   Right choroid          Stomach:          Appears normal, left
                   plexus cyst,
                   5.3mm
                                                            sided

 Cerebellum:       Appears normal         Abdomen:          Appears normal
 Posterior Fossa:  Appears normal         Abdominal Wall:   Appears nml (cord
                                                            insert, abd wall)
 Nuchal Fold:      Appears normal         Cord Vessels:     Appears normal (3
                                                            vessel cord)
 Face:             Appears normal         Kidneys:          Appear normal
                   (orbits and profile)
 Lips:             Appears normal         Bladder:          Appears normal
 Heart:            Not well visualized    Spine:            Appears normal
 RVOT:             Not well visualized    Lower             Appears normal
                                          Extremities:
 LVOT:             Not well visualized    Upper             Appears normal
                                          Extremities:

 Other:  Fetus appears to be a female. Heels and 5th digit appear normal.
Targeted Anatomy

 Fetal Central Nervous System
 Cisterna Magna:
Cervix Uterus Adnexa

 Cervical Length:    3.3      cm

 Cervix:       Normal appearance by transabdominal scan. Appears
               closed, without funnelling.
 Left Ovary:    Not visualized. No adnexal mass visualized.
 Right Ovary:   Within normal limits.
Comments

 Ms. Konoka was referred to the [facility] due to a prior child with
 Dandy Walker malformation and developmental delay.  The
 patient's fetal anatomic survey is not complete due to
 maternal body habitus and fetal position.  No evidence of
 Dandy Walker malformation were noted.  However, a right
 sided choroid plexus cysts was noted.  No other soft markers
 of anecuploidy or gross fetal anomalies were identified.  A
 chorioid plexus cysts is a common variant (1-2%) of all
 pregnancies, and carries a small association with Trisomy 18.
 However, there were no other ultrasound stigmata of Trisomy
 18, making the possibility of Trisomy 18 less likely.  A follow-
 up ultrasound will be performed in 4 weeks to reassess fetal
 growth and anatomy.

 The patient also had genetics counseling today.  For full
 details of that visit please see accompanying documentation.
Impression

 Single living intrauterine pregnancy at 18 weeks 4 days.
 Appropriate fetal growth (32%).
 Normal amniotic fluid volume.
 No gross fetal anomalies identified.
Recommendations

 Recommend follow-up ultrasound examination in 4 weeks.

 questions or concerns.
                Slavski, Greete-Maria

## 2015-02-06 IMAGING — US US OB FOLLOW-UP
1 series · 12 of 28 positions shown · non-contrast
Comparison: none

[Series 1: us ob follow-up · 0.18mm/px · 12 of 55 slices shown]
[im 3/55]
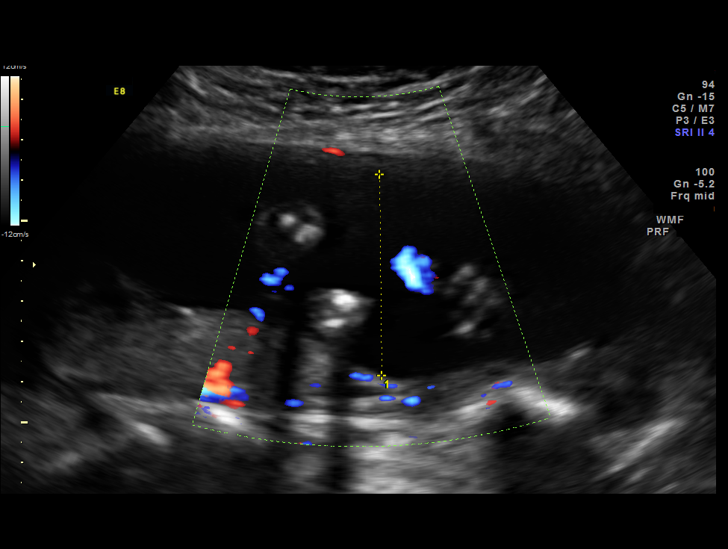
[im 7/55]
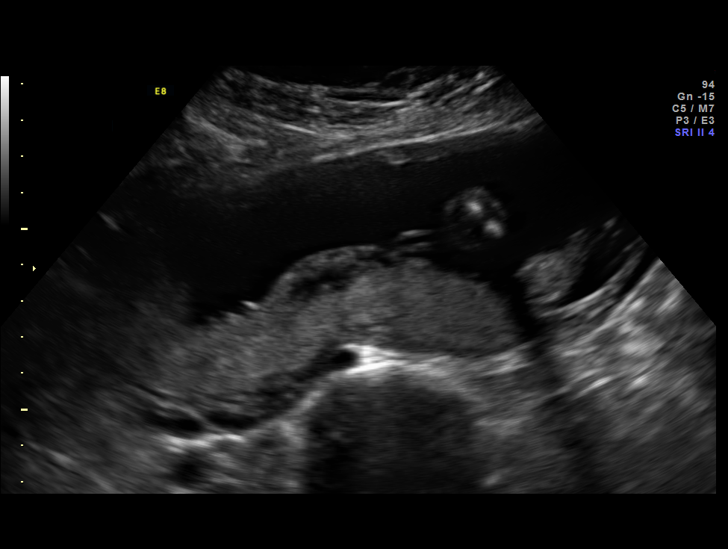
[im 11/55]
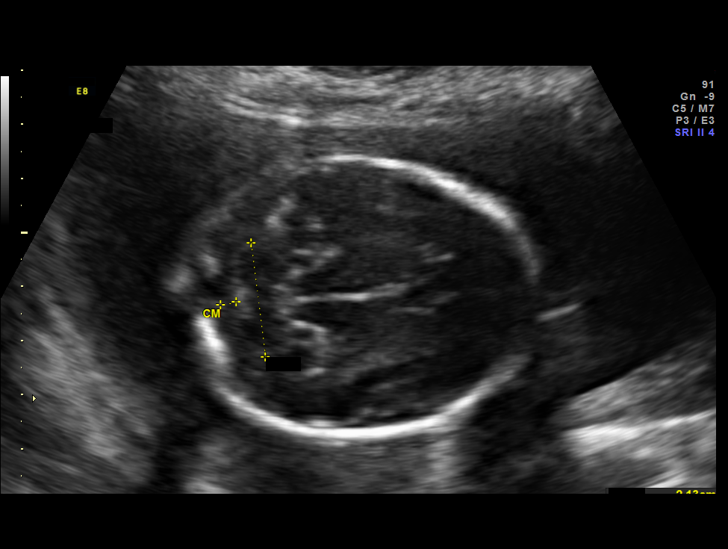
[im 17/55]
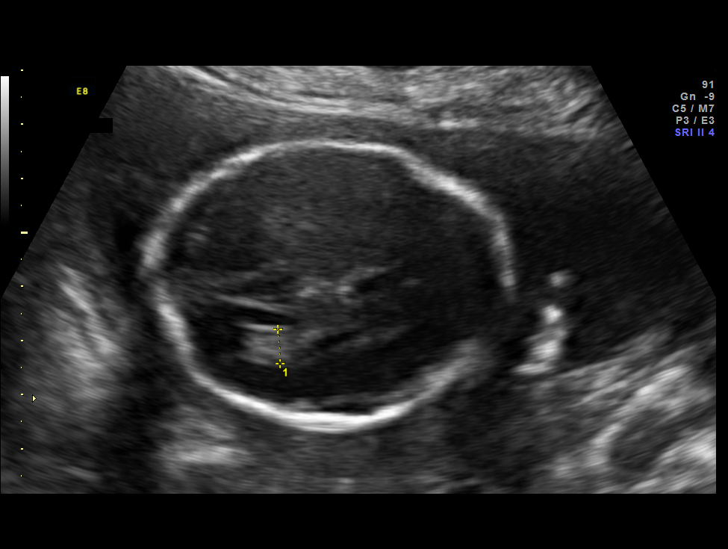
[im 21/55]
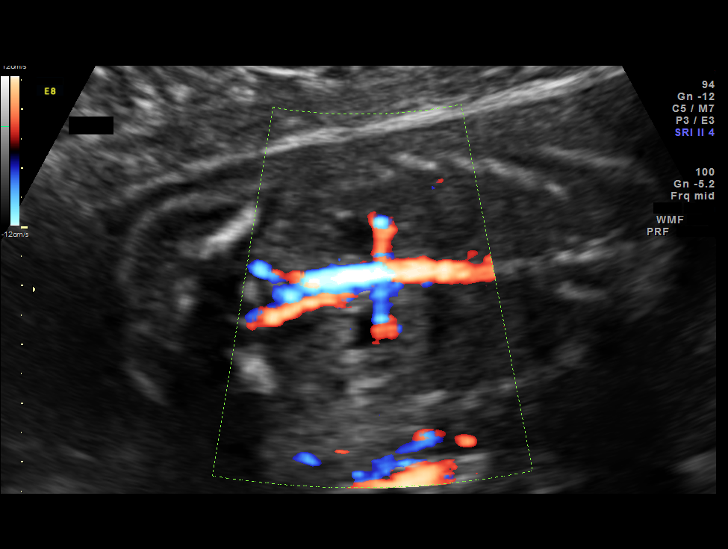
[im 25/55]
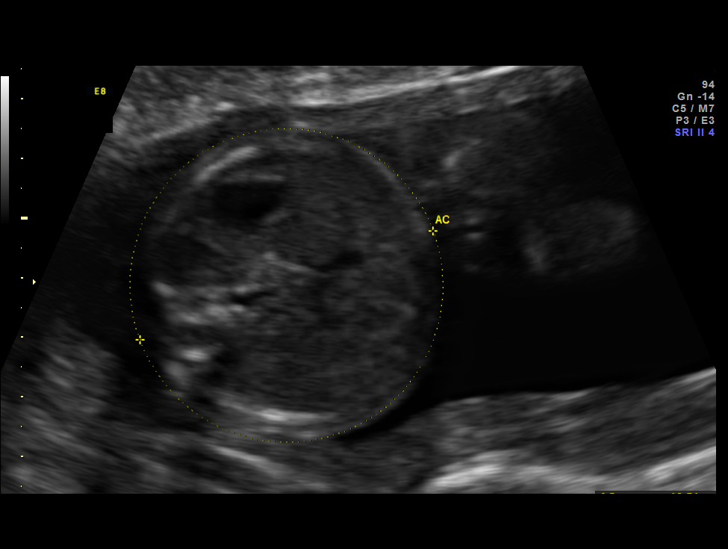
[im 31/55]
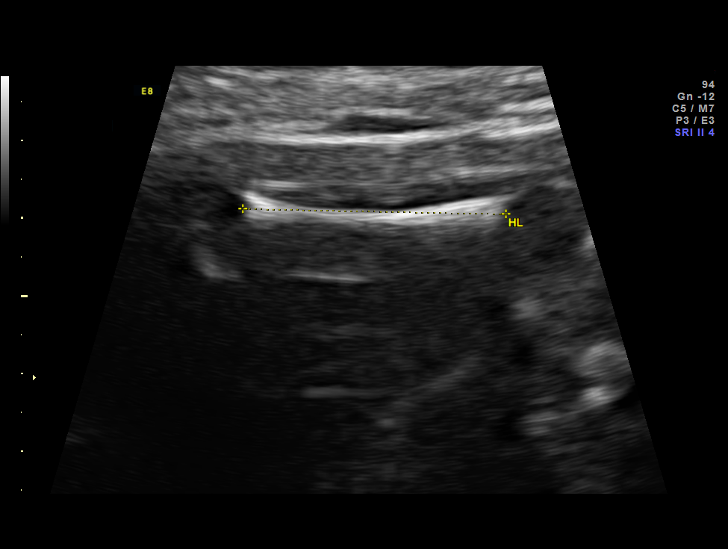
[im 35/55]
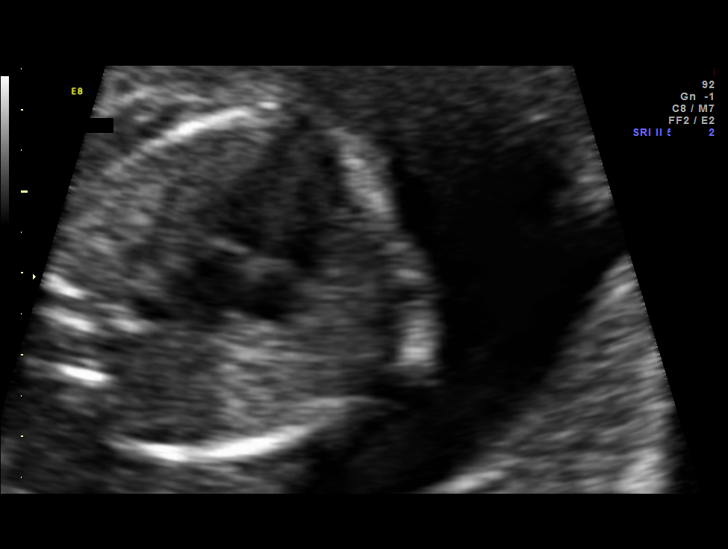
[im 39/55]
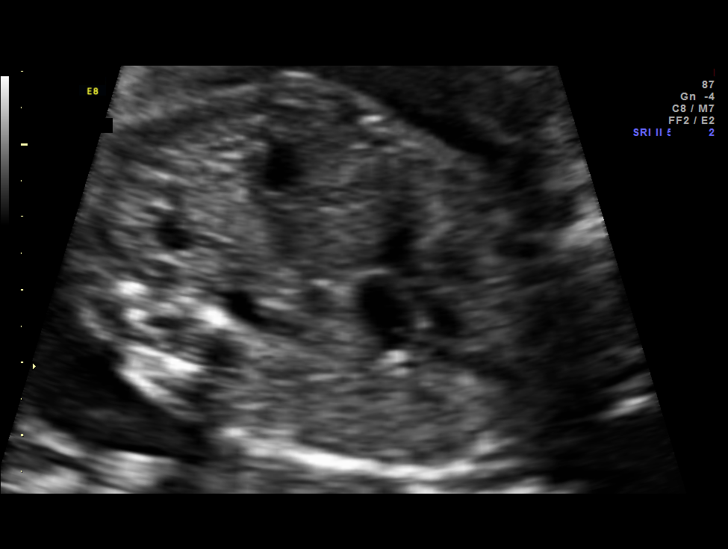
[im 45/55]
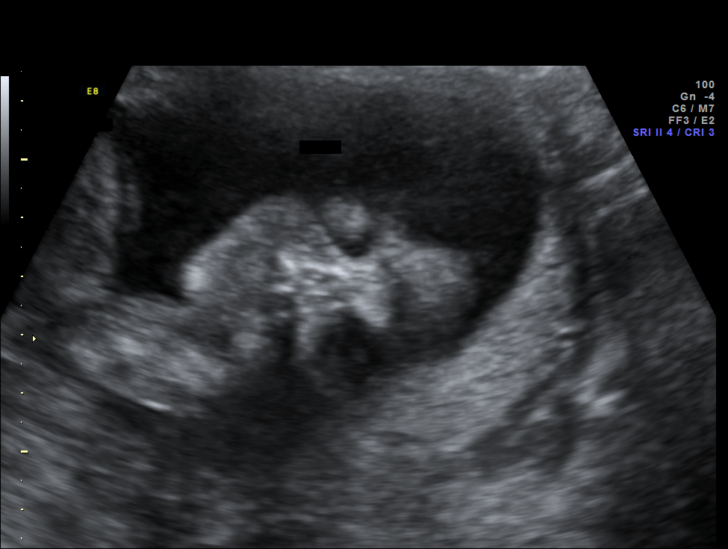
[im 49/55]
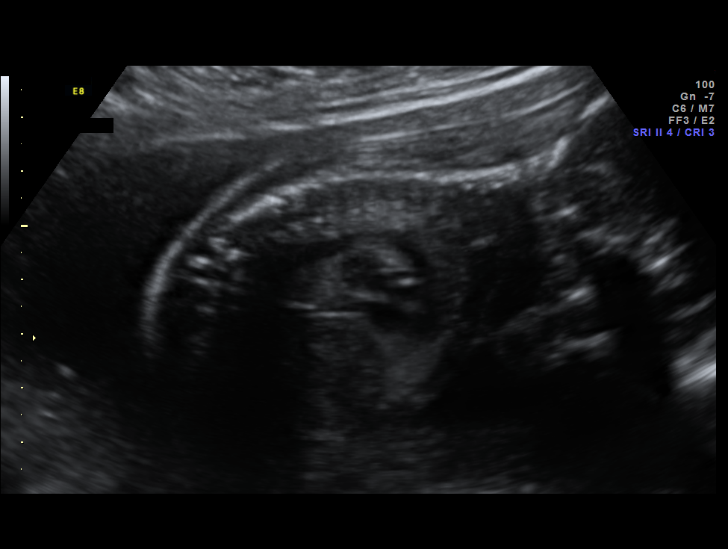
[im 53/55]
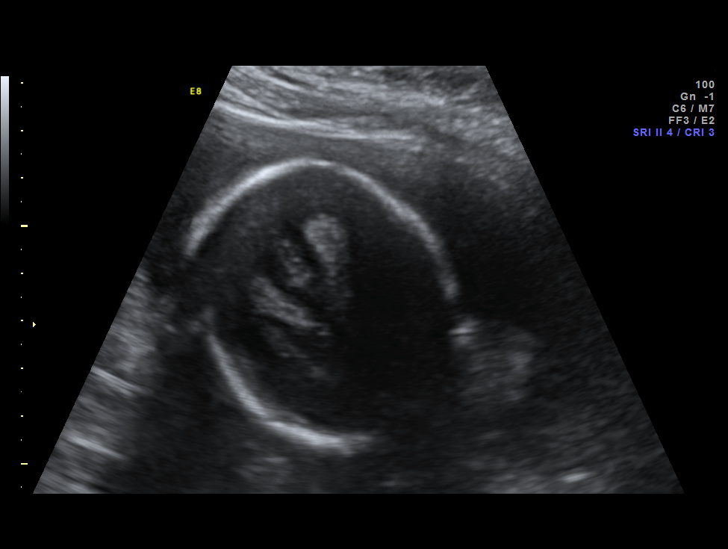

[12 of 28 positions shown; findings below may reference images not displayed]

OBSTETRICS REPORT
                      (Signed Final 06/05/2013 [DATE])

Service(s) Provided

 US OB FOLLOW UP                                       76816.1
Indications

 Choroid plexus cyst
 Elevated Inhibin (2.44 MoM)
 History of genetic / anatomic abnormality -
 daughter with Dandy-Walker
 Cocaine abuse
 Cigarette smoker
 Follow-up incomplete fetal anatomic evaluation
Fetal Evaluation

 Num Of Fetuses:    1
 Fetal Heart Rate:  152                          bpm
 Cardiac Activity:  Observed
 Presentation:      Cephalic
 Placenta:          Posterior right, above
                    cervical os
 P. Cord            Previously Visualized
 Insertion:

 Amniotic Fluid
 AFI FV:      Subjectively within normal limits
                                             Larg Pckt:     4.9  cm
Biometry

 BPD:       51  mm     G. Age:  21w 4d                CI:         76.5   70 - 86
 OFD:     66.7  mm                                    FL/HC:      18.7   19.2 -

 HC:     189.8  mm     G. Age:  21w 2d        4  %    HC/AC:      1.15   1.05 -

 AC:     164.9  mm     G. Age:  21w 4d       15  %    FL/BPD:     69.4   71 - 87
 FL:      35.4  mm     G. Age:  21w 1d        7  %    FL/AC:      21.5   20 - 24
 HUM:       34  mm     G. Age:  21w 4d       23  %
 CER:     21.3  mm     G. Age:  20w 2d      < 5  %

 Est. FW:     419  gm    0 lb 15 oz      22  %
Gestational Age

 LMP:           22w 4d        Date:  12/29/12                 EDD:   10/05/13
 U/S Today:     21w 3d                                        EDD:   10/13/13
 Best:          22w 4d     Det. By:  LMP  (12/29/12)          EDD:   10/05/13
Anatomy

 Cranium:          Appears normal         Aortic Arch:      Appears normal
 Fetal Cavum:      Appears normal         Ductal Arch:      Appears normal
 Ventricles:       Appears normal         Diaphragm:        Appears normal
 Choroid Plexus:   Resolved CPC           Stomach:          Appears normal, left
                                                            sided
 Cerebellum:       Appears normal         Abdomen:          Appears normal
 Posterior Fossa:  Appears normal         Abdominal Wall:   Previously seen
 Nuchal Fold:      Previously seen        Cord Vessels:     Previously seen
 Face:             Orbits and profile     Kidneys:          Bilat pyelectasis, 4
                   previously seen
                                                            mm
 Lips:             Previously seen        Bladder:          Appears normal
 Heart:            Appears normal         Spine:            Previously seen
                   (4CH, axis, and
                   situs)
 RVOT:             Appears normal         Lower             Previously seen
                                          Extremities:
 LVOT:             Appears normal         Upper             Previously seen
                                          Extremities:

 Other:  Fetus appears to be a female. Heels and 5th digit previously seen.
Targeted Anatomy

 Fetal Central Nervous System
 Cisterna Magna:
Cervix Uterus Adnexa

 Cervical Length:    4        cm

 Cervix:       Normal appearance by transabdominal scan.
 Left Ovary:    Within normal limits.
 Right Ovary:   Within normal limits.
 Adnexa:     No abnormality visualized.
Impression

 Single IUP at 22w 4d
 Previous child with Dandy Walker syndrome, cocaine,
 tobacco use
 Normal quad screen (elevated inhibin noted 2.44 MoM)
 Interval growth is appropriate (22nd %tile)
 Bilateral renal tract dilation noted (4mm)
 Otherwise normal interval anatomy
 Normal amniotic fluid volume
Recommendations

 Recommend follow-up ultrasound examination in 6-8 weeks
 for interval growth and to reevaluate the fetal kidneys

 questions or concerns.
